# Patient Record
Sex: Female | Born: 1990 | Race: White | Hispanic: No | Marital: Single | Smoking: Never smoker
Health system: Southern US, Community
[De-identification: ages and names within clinical notes are randomized; demographics above are authoritative.]

## PROBLEM LIST (undated history)

## (undated) DIAGNOSIS — N809 Endometriosis, unspecified: Secondary | ICD-10-CM

## (undated) DIAGNOSIS — N200 Calculus of kidney: Secondary | ICD-10-CM

## (undated) DIAGNOSIS — D649 Anemia, unspecified: Secondary | ICD-10-CM

## (undated) DIAGNOSIS — J45909 Unspecified asthma, uncomplicated: Secondary | ICD-10-CM

## (undated) DIAGNOSIS — Z5189 Encounter for other specified aftercare: Secondary | ICD-10-CM

---
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
## 2010-02-02 ENCOUNTER — Emergency Department: Payer: Self-pay | Admitting: Emergency Medicine

## 2012-02-16 ENCOUNTER — Ambulatory Visit: Payer: Self-pay

## 2012-02-18 LAB — PATHOLOGY REPORT

## 2012-05-02 ENCOUNTER — Emergency Department: Payer: Self-pay | Admitting: Emergency Medicine

## 2012-05-02 LAB — URINALYSIS, COMPLETE
Bilirubin,UR: NEGATIVE
Blood: NEGATIVE
Ketone: NEGATIVE
Ph: 7 (ref 4.5–8.0)
Protein: NEGATIVE
Specific Gravity: 1.019 (ref 1.003–1.030)
Squamous Epithelial: 5
WBC UR: 1 /HPF (ref 0–5)

## 2012-05-02 LAB — COMPREHENSIVE METABOLIC PANEL
Albumin: 3.9 g/dL (ref 3.4–5.0)
Alkaline Phosphatase: 174 U/L — ABNORMAL HIGH (ref 50–136)
BUN: 12 mg/dL (ref 7–18)
Calcium, Total: 8.8 mg/dL (ref 8.5–10.1)
Co2: 24 mmol/L (ref 21–32)
Creatinine: 0.79 mg/dL (ref 0.60–1.30)
EGFR (African American): >60
Glucose: 101 mg/dL — ABNORMAL HIGH (ref 65–99)
Osmolality: 283 (ref 275–301)
Potassium: 4 mmol/L (ref 3.5–5.1)
Sodium: 142 mmol/L (ref 136–145)
Total Protein: 7.9 g/dL (ref 6.4–8.2)

## 2012-05-02 LAB — CBC
HCT: 42.7 % (ref 35.0–47.0)
MCHC: 33.4 g/dL (ref 32.0–36.0)
MCV: 86 fL (ref 80–100)
WBC: 8 10*3/uL (ref 3.6–11.0)

## 2012-05-02 LAB — LIPASE, BLOOD: Lipase: 114 U/L (ref 73–393)

## 2012-07-08 ENCOUNTER — Emergency Department: Payer: Self-pay | Admitting: Emergency Medicine

## 2012-07-08 LAB — URINALYSIS, COMPLETE
Bilirubin,UR: NEGATIVE
Blood: NEGATIVE
Ketone: NEGATIVE
Ph: 7 (ref 4.5–8.0)
RBC,UR: 7 /HPF (ref 0–5)
Specific Gravity: 1.021 (ref 1.003–1.030)
Squamous Epithelial: 4

## 2014-02-12 ENCOUNTER — Ambulatory Visit: Payer: Self-pay | Admitting: Emergency Medicine

## 2014-02-25 ENCOUNTER — Ambulatory Visit: Payer: Self-pay | Admitting: Physician Assistant

## 2014-07-13 ENCOUNTER — Emergency Department: Payer: Self-pay | Admitting: Emergency Medicine

## 2014-07-13 LAB — CBC
HCT: 40.4 %
HGB: 13.3 g/dL
MCH: 28.3 pg
MCHC: 32.9 g/dL
MCV: 86 fL
Platelet: 294 x10 3/mm 3
RBC: 4.7 X10 6/mm 3
RDW: 13.2 %
WBC: 9.6 x10 3/mm 3

## 2014-07-13 LAB — WET PREP, GENITAL

## 2014-07-13 LAB — GC/CHLAMYDIA PROBE AMP

## 2014-07-13 LAB — HCG, QUANTITATIVE, PREGNANCY: Beta Hcg, Quant.: <1 m[IU]/mL — ABNORMAL LOW

## 2014-10-29 ENCOUNTER — Emergency Department: Payer: Self-pay | Admitting: Emergency Medicine

## 2014-10-29 LAB — CBC WITH DIFFERENTIAL/PLATELET
Basophil #: 0 10*3/uL (ref 0.0–0.1)
Basophil %: 0.5 %
EOS ABS: 0.1 10*3/uL (ref 0.0–0.7)
EOS PCT: 1.1 %
HCT: 31.8 % — AB (ref 35.0–47.0)
HGB: 10.4 g/dL — ABNORMAL LOW (ref 12.0–16.0)
LYMPHS ABS: 2.3 10*3/uL (ref 1.0–3.6)
Lymphocyte %: 24.6 %
MCH: 27.5 pg (ref 26.0–34.0)
MCHC: 32.7 g/dL (ref 32.0–36.0)
MCV: 84 fL (ref 80–100)
MONO ABS: 0.4 x10 3/mm (ref 0.2–0.9)
MONOS PCT: 4.8 %
NEUTROS ABS: 6.3 10*3/uL (ref 1.4–6.5)
NEUTROS PCT: 69 %
Platelet: 375 10*3/uL (ref 150–440)
RBC: 3.78 10*6/uL — AB (ref 3.80–5.20)
RDW: 13.7 % (ref 11.5–14.5)
WBC: 9.2 10*3/uL (ref 3.6–11.0)

## 2014-10-29 LAB — URINALYSIS, COMPLETE
Bacteria: NONE SEEN
Bilirubin,UR: NEGATIVE
GLUCOSE, UR: NEGATIVE mg/dL (ref 0–75)
Leukocyte Esterase: NEGATIVE
Nitrite: NEGATIVE
PH: 5 (ref 4.5–8.0)
Protein: 30
RBC,UR: 30 /HPF (ref 0–5)
Specific Gravity: 1.023 (ref 1.003–1.030)
Squamous Epithelial: 1
WBC UR: 3 /HPF (ref 0–5)

## 2014-10-29 LAB — COMPREHENSIVE METABOLIC PANEL
ANION GAP: 9 (ref 7–16)
AST: 24 U/L (ref 15–37)
Albumin: 3.2 g/dL — ABNORMAL LOW (ref 3.4–5.0)
Alkaline Phosphatase: 122 U/L — ABNORMAL HIGH
BUN: 14 mg/dL (ref 7–18)
Bilirubin,Total: 0.2 mg/dL (ref 0.2–1.0)
CALCIUM: 8.4 mg/dL — AB (ref 8.5–10.1)
CO2: 25 mmol/L (ref 21–32)
Chloride: 105 mmol/L (ref 98–107)
Creatinine: 0.88 mg/dL (ref 0.60–1.30)
EGFR (Non-African Amer.): >60
Glucose: 102 mg/dL — ABNORMAL HIGH (ref 65–99)
Osmolality: 278 (ref 275–301)
Potassium: 3.9 mmol/L (ref 3.5–5.1)
SGPT (ALT): 70 U/L — ABNORMAL HIGH
SODIUM: 139 mmol/L (ref 136–145)
Total Protein: 7.2 g/dL (ref 6.4–8.2)

## 2014-10-29 LAB — LIPASE, BLOOD: LIPASE: 57 U/L — AB (ref 73–393)

## 2014-11-11 ENCOUNTER — Emergency Department: Payer: Self-pay | Admitting: Emergency Medicine

## 2014-12-01 ENCOUNTER — Inpatient Hospital Stay: Payer: Self-pay | Admitting: Internal Medicine

## 2014-12-01 LAB — COMPREHENSIVE METABOLIC PANEL
ANION GAP: 10 (ref 7–16)
Albumin: 3.1 g/dL — ABNORMAL LOW (ref 3.4–5.0)
Alkaline Phosphatase: 103 U/L
BILIRUBIN TOTAL: 0.2 mg/dL (ref 0.2–1.0)
BUN: 13 mg/dL (ref 7–18)
CALCIUM: 8.3 mg/dL — AB (ref 8.5–10.1)
Chloride: 110 mmol/L — ABNORMAL HIGH (ref 98–107)
Co2: 21 mmol/L (ref 21–32)
Creatinine: 1.15 mg/dL (ref 0.60–1.30)
EGFR (Non-African Amer.): >60
GLUCOSE: 111 mg/dL — AB (ref 65–99)
Osmolality: 282 (ref 275–301)
POTASSIUM: 3.9 mmol/L (ref 3.5–5.1)
SGOT(AST): 14 U/L — ABNORMAL LOW (ref 15–37)
SGPT (ALT): 26 U/L
Sodium: 141 mmol/L (ref 136–145)
TOTAL PROTEIN: 6.7 g/dL (ref 6.4–8.2)

## 2014-12-01 LAB — URINALYSIS, COMPLETE
Bilirubin,UR: NEGATIVE
GLUCOSE, UR: NEGATIVE mg/dL (ref 0–75)
Leukocyte Esterase: NEGATIVE
NITRITE: NEGATIVE
Ph: 5 (ref 4.5–8.0)
Protein: NEGATIVE
RBC,UR: 9 /HPF (ref 0–5)
SPECIFIC GRAVITY: 1.023 (ref 1.003–1.030)
WBC UR: 4 /HPF (ref 0–5)

## 2014-12-01 LAB — CBC WITH DIFFERENTIAL/PLATELET
BASOS ABS: 0.1 10*3/uL (ref 0.0–0.1)
BASOS ABS: 0 10*3/uL (ref 0.0–0.1)
BASOS PCT: 0.2 %
Basophil %: 0.6 %
EOS PCT: 0.1 %
Eosinophil #: 0 10*3/uL (ref 0.0–0.7)
Eosinophil #: 0 10*3/uL (ref 0.0–0.7)
Eosinophil %: 0.1 %
HCT: 24.5 % — ABNORMAL LOW (ref 35.0–47.0)
HCT: 24.9 % — ABNORMAL LOW (ref 35.0–47.0)
HGB: 7.2 g/dL — ABNORMAL LOW (ref 12.0–16.0)
HGB: 7.4 g/dL — ABNORMAL LOW (ref 12.0–16.0)
LYMPHS PCT: 7 %
Lymphocyte #: 0.9 10*3/uL — ABNORMAL LOW (ref 1.0–3.6)
Lymphocyte #: 1 10*3/uL (ref 1.0–3.6)
Lymphocyte %: 6.7 %
MCH: 22.4 pg — AB (ref 26.0–34.0)
MCH: 22.4 pg — ABNORMAL LOW (ref 26.0–34.0)
MCHC: 29.6 g/dL — ABNORMAL LOW (ref 32.0–36.0)
MCHC: 29.9 g/dL — ABNORMAL LOW (ref 32.0–36.0)
MCV: 76 fL — AB (ref 80–100)
MCV: 75 fL — ABNORMAL LOW (ref 80–100)
MONO ABS: 0.5 x10 3/mm (ref 0.2–0.9)
Monocyte #: 0.4 x10 3/mm (ref 0.2–0.9)
Monocyte %: 3.3 %
Monocyte %: 3.4 %
Neutrophil #: 11.7 10*3/uL — ABNORMAL HIGH (ref 1.4–6.5)
Neutrophil #: 13.1 10*3/uL — ABNORMAL HIGH (ref 1.4–6.5)
Neutrophil %: 89 %
Neutrophil %: 89.6 %
Platelet: 349 10*3/uL (ref 150–440)
Platelet: 359 10*3/uL (ref 150–440)
RBC: 3.24 10*6/uL — ABNORMAL LOW (ref 3.80–5.20)
RBC: 3.33 10*6/uL — AB (ref 3.80–5.20)
RDW: 18.3 % — AB (ref 11.5–14.5)
RDW: 18.2 % — ABNORMAL HIGH (ref 11.5–14.5)
WBC: 13.1 10*3/uL — ABNORMAL HIGH (ref 3.6–11.0)
WBC: 14.6 10*3/uL — ABNORMAL HIGH (ref 3.6–11.0)

## 2014-12-01 LAB — LIPASE, BLOOD: Lipase: 69 U/L — ABNORMAL LOW (ref 73–393)

## 2014-12-01 LAB — HCG, QUANTITATIVE, PREGNANCY

## 2014-12-02 LAB — CBC WITH DIFFERENTIAL/PLATELET
Basophil #: 0 10*3/uL (ref 0.0–0.1)
Basophil %: 0.1 %
EOS PCT: 0 %
Eosinophil #: 0 10*3/uL (ref 0.0–0.7)
HCT: 24.2 % — ABNORMAL LOW (ref 35.0–47.0)
HGB: 7.4 g/dL — AB (ref 12.0–16.0)
LYMPHS ABS: 1.7 10*3/uL (ref 1.0–3.6)
Lymphocyte %: 13.7 %
MCH: 22.9 pg — AB (ref 26.0–34.0)
MCHC: 30.5 g/dL — ABNORMAL LOW (ref 32.0–36.0)
MCV: 75 fL — AB (ref 80–100)
Monocyte #: 0.6 x10 3/mm (ref 0.2–0.9)
Monocyte %: 4.7 %
Neutrophil #: 10.1 10*3/uL — ABNORMAL HIGH (ref 1.4–6.5)
Neutrophil %: 81.5 %
Platelet: 379 10*3/uL (ref 150–440)
RBC: 3.22 10*6/uL — AB (ref 3.80–5.20)
RDW: 18.3 % — ABNORMAL HIGH (ref 11.5–14.5)
WBC: 12.4 10*3/uL — ABNORMAL HIGH (ref 3.6–11.0)

## 2014-12-02 LAB — BASIC METABOLIC PANEL
ANION GAP: 8 (ref 7–16)
BUN: 10 mg/dL (ref 7–18)
CALCIUM: 8.2 mg/dL — AB (ref 8.5–10.1)
CO2: 24 mmol/L (ref 21–32)
Chloride: 111 mmol/L — ABNORMAL HIGH (ref 98–107)
Creatinine: 1 mg/dL (ref 0.60–1.30)
EGFR (African American): >60
EGFR (Non-African Amer.): >60
Glucose: 104 mg/dL — ABNORMAL HIGH (ref 65–99)
Osmolality: 284 (ref 275–301)
Potassium: 3.8 mmol/L (ref 3.5–5.1)
Sodium: 143 mmol/L (ref 136–145)

## 2014-12-03 LAB — CBC WITH DIFFERENTIAL/PLATELET
BASOS ABS: 0 10*3/uL (ref 0.0–0.1)
Basophil %: 0.3 %
Eosinophil #: 0 10*3/uL (ref 0.0–0.7)
Eosinophil %: 0.1 %
HCT: 25.3 % — AB (ref 35.0–47.0)
HGB: 7.7 g/dL — ABNORMAL LOW (ref 12.0–16.0)
Lymphocyte #: 1.7 10*3/uL (ref 1.0–3.6)
Lymphocyte %: 16.5 %
MCH: 22.8 pg — ABNORMAL LOW (ref 26.0–34.0)
MCHC: 30.5 g/dL — AB (ref 32.0–36.0)
MCV: 75 fL — ABNORMAL LOW (ref 80–100)
MONOS PCT: 4.4 %
Monocyte #: 0.5 x10 3/mm (ref 0.2–0.9)
NEUTROS ABS: 8.1 10*3/uL — AB (ref 1.4–6.5)
Neutrophil %: 78.7 %
Platelet: 341 10*3/uL (ref 150–440)
RBC: 3.37 10*6/uL — ABNORMAL LOW (ref 3.80–5.20)
RDW: 17.9 % — AB (ref 11.5–14.5)
WBC: 10.3 10*3/uL (ref 3.6–11.0)

## 2014-12-03 LAB — URINE CULTURE

## 2014-12-04 LAB — HEMOGLOBIN: HGB: 8.2 g/dL — ABNORMAL LOW (ref 12.0–16.0)

## 2014-12-09 DIAGNOSIS — K2270 Barrett's esophagus without dysplasia: Secondary | ICD-10-CM | POA: Insufficient documentation

## 2014-12-09 DIAGNOSIS — N200 Calculus of kidney: Secondary | ICD-10-CM | POA: Insufficient documentation

## 2014-12-09 DIAGNOSIS — N12 Tubulo-interstitial nephritis, not specified as acute or chronic: Secondary | ICD-10-CM | POA: Insufficient documentation

## 2014-12-16 ENCOUNTER — Ambulatory Visit: Payer: Self-pay | Admitting: Urology

## 2015-03-02 NOTE — Op Note (Signed)
PATIENT NAMJamesetta Orleans:  [name], Ashley [name] MR#:  [id number] DATE OF BIRTH:  04-10-1991  DATE OF PROCEDURE:  02/16/2012  PREOPERATIVE DIAGNOSIS: Incomplete abortion.   POSTOPERATIVE DIAGNOSIS: Incomplete abortion.   OPERATION PERFORMED: Completion curettage.   SURGEON: Deloris Pinghilip J. Luella Cookosenow, MD    OPERATIVE FINDINGS: Small amount of tissue.   OPERATION: After adequate general anesthesia, the patient was prepped and draped in routine fashion. Cervix was grasped with a Jacob's tenaculum. It was already dilated. The uterine cavity was systematically suctioned and curetted with #12 suction curette with return of a small amount of tissue. Controlled curettage revealed a "clean" field for 360 degrees.   The patient tolerated the procedure well and left the operating room in good condition. Sponge and needle counts were said to be correct at the end of procedure.   ____________________________ Deloris PingPhilip J. Luella Cookosenow, MD pjr:drc [name]: 02/16/2012 11:11:22 ET T: 02/16/2012 11:59:14 ET JOB#: [id number]  cc: Deloris PingPhilip J. Luella Cookosenow, MD, <Dictator> Towana BadgerPHILIP J Curry Dulski MD ELECTRONICALLY SIGNED 02/29/2012 18:54

## 2015-03-09 NOTE — H&P (Signed)
PATIENT NAMEADEAN, Ashley [name] MR#:  [id number] DATE OF BIRTH:  09-Feb-1991  DATE OF ADMISSION:  12/01/2014  REFERRING PHYSICIAN: Sheryl L. Mindi Junker, MD   FAMILY PHYSICIAN: Nonlocal.   REASON FOR ADMISSION: Abdominal pain with nausea, vomiting.   HISTORY OF PRESENT ILLNESS: The patient is a 24 year old female with a history of asthma and miscarriage with D and C approximately 1 year ago. Presents to the Emergency Room with a 1-2 day history of right-sided abdominal pain associated with intractable nausea and vomiting. She cannot keep anything down. In the Emergency Room, the patient was noted to be profoundly anemic. CT scan revealed an obstructing stone in the right kidney. She is now admitted for further evaluation.   PAST MEDICAL HISTORY:  1.  Status post miscarriage with D and C 1 year ago.  2.  Microcytic anemia.  3.  Asthma.  4.  Nephrolithiasis.   MEDICATIONS:  1.  Baclofen 10 mg p.o. daily as needed.  2.  Naproxen 500 mg p.o. b.i.d. as needed.  3.  Oral birth control 1 p.o. daily.  4.  Ventolin 2 puffs q.i.d. p.r.n. shortness of breath.  5.  Zofran 4 mg p.o. q. 8 hours p.r.n. nausea and vomiting.   ALLERGIES: PERCOCET.   SOCIAL HISTORY: Negative for alcohol or tobacco abuse.   FAMILY HISTORY: Positive for hypertension, but otherwise unremarkable.   REVIEW OF SYSTEMS: CONSTITUTIONAL: No fever or change in weight.  EYES: No blurred or double vision. No glaucoma.  EARS, NOSE, AND THROAT: No tinnitus or hearing loss. No nasal discharge or bleeding. No difficulty swallowing.  RESPIRATORY: No cough or wheezing. Denies hemoptysis. No painful respiration.  CARDIOVASCULAR: No chest pain or orthopnea. No palpitations or syncope.  GASTROINTESTINAL: The patient has had nausea and vomiting, but denies diarrhea. Has had abdominal pain. No change in bowel habits.  GENITOURINARY: No hematuria, although she has had some dysuria. No incontinence.  ENDOCRINE: No polyuria or polydipsia. No  heat or cold intolerance.  HEMATOLOGIC: The patient admits to anemia, but denies bruising or bleeding.  LYMPHATIC: No swollen glands.  MUSCULOSKELETAL: The patient denies pain in her neck, shoulders, knees, hips. Does have some back pain. No gout.  NEUROLOGIC: No numbness or migraines. Denies stroke or seizures.  PSYCHOLOGICAL: The patient denies anxiety, insomnia or depression.   PHYSICAL EXAMINATION:  GENERAL: The patient is in no acute distress.  VITAL SIGNS: Currently remarkable for a blood pressure of 117/69 with a heart rate of 76, respiratory rate of 18, temperature of 98.5 and a saturation 100% on room air.  HEENT: Normocephalic, atraumatic. Pupils equally round, reactive to light and accommodation. Extraocular movements are intact. Sclerae are anicteric. Conjunctivae are clear. Oropharynx is clear. NECK: Supple without JVD. No adenopathy or thyromegaly is noted.  LUNGS: Clear to auscultation and percussion without wheezes, rales or rhonchi. No dullness. Respiratory effort is normal.  CARDIAC: Regular rate and rhythm with a normal S1, S2. No significant rubs or gallops noted. There is a systolic ejection murmur present. PMI is nondisplaced. Chest wall is nontender.  ABDOMEN: Soft with some right-sided CVA tenderness. No rebound or guarding. Normoactive bowel sounds. No organomegaly or masses were appreciated. No hernias or bruits were noted.  EXTREMITIES: Without clubbing, cyanosis or edema. Pulses were 2+ bilaterally.  SKIN: Warm and dry without rash or lesions.  NEUROLOGIC: Revealed cranial nerves II-XII grossly intact. Deep tendon reflexes were symmetric. Motor and sensory examination is nonfocal.  PSYCHIATRIC: Revealed a patient who is alert and oriented  to person, place and time. She was cooperative and used good judgment.   LABORATORY DATA: Glucose was 111 with a BUN of 13, creatinine 1.15 and a GFR of greater than 60. Her lipase was 69. Her white count was 14.6 with a hemoglobin of  7.4. MCV was 75. Urinalysis revealed 2+ blood with 9 rbc's per high-powered field with trace bacteria. CT of the abdomen and pelvis revealed right-sided hydronephrosis with an obstructing 4 mm stone.   ASSESSMENT:  1.  Acute nephrolithiasis with hydronephrosis.  2.  Abdominal pain.  3.  Intractable nausea and vomiting.  4.  Iron-deficiency anemia.  5.  Menorrhagia.   PLAN: The patient will be admitted to the floor with IV pain medications and IV Zofran as needed for nausea or vomiting. We will begin IV fluids. She will be kept n.p.o. except for ice chips and medications. We will obtain a consult from urology in regard to her kidney stone and a consult from GYN in regard to her menorrhagia and anemia. We will follow up routine laboratories in the morning. Begin empiric IV antibiotics at this time. Further treatment and evaluation will depend upon the patient's progress.   TOTAL TIME SPENT ON THIS PATIENT: Fifty minutes.    ____________________________ Duane LopeJeffrey D. Judithann SheenSparks, MD jds:TT D: 12/01/2014 19:49:15 ET T: 12/01/2014 20:11:11 ET JOB#: [id number]  cc: Duane LopeJeffrey D. Judithann SheenSparks, MD, <Dictator> JEFFREY Rodena Medin SPARKS MD ELECTRONICALLY SIGNED 12/02/2014 11:03

## 2015-03-09 NOTE — Consult Note (Signed)
Chief Complaint:  Subjective/Chief Complaint pod 1 s/p right ureteral stent. Pain improved. No n/v, no fevers/chills, wants to go home   VITAL SIGNS/ANCILLARY NOTES: **Vital Signs.:   25-Jan-16 08:07  Vital Signs Type Q 4hr; Post op  Temperature Temperature (F) 98.4  Celsius 36.8  Temperature Source oral  Pulse Pulse 99  Respirations Respirations 18  Systolic BP Systolic BP 222  Diastolic BP (mmHg) Diastolic BP (mmHg) 76  Mean BP 90  Pulse Ox % Pulse Ox % 97  Pulse Ox Activity Level  At rest  Oxygen Delivery Room Air/ 21 %  *Intake and Output.:   Daily 25-Jan-16 07:00  Grand Totals Intake:  747 Output:  800    Net:  -60 24 Hr.:  -53  IV (Primary)      In:  747  Urine ml     Out:  800  Length of Stay Totals Intake:  747 Output:  800    Net:  -53   Brief Assessment:  GEN well developed, well nourished   Cardiac Regular  no murmur  --Rub  --Gallop   Respiratory normal resp effort  clear BS   Gastrointestinal Normal   Gastrointestinal details normal Soft  Nontender  Nondistended   EXTR negative cyanosis/clubbing, negative edema   Additional Physical Exam no CVAT   Lab Results: Routine Chem:  25-Jan-16 05:00   Glucose, Serum  104  BUN 10  Creatinine (comp) 1.00  Sodium, Serum 143  Potassium, Serum 3.8  Chloride, Serum  111  CO2, Serum 24  Calcium (Total), Serum  8.2  Anion Gap 8  Osmolality (calc) 284  eGFR (African American) >60  eGFR (Non-African American) >60 (eGFR values <23m/min/1.73 m2 may be an indication of chronic kidney disease (CKD). Calculated eGFR, using the MRDR Study equation, is useful in  patients with stable renal function. The eGFR calculation will not be reliable in acutely ill patients when serum creatinine is changing rapidly. It is not useful in patients on dialysis. The eGFR calculation may not be applicable to patients at the low and high extremes of body sizes, pregnant women, and vegetarians.)  Routine Hem:  25-Jan-16  05:00   WBC (CBC)  12.4  RBC (CBC)  3.22  Hemoglobin (CBC)  7.4  Hematocrit (CBC)  24.2  Platelet Count (CBC) 379  MCV  75  MCH  22.9  MCHC  30.5  RDW  18.3  Neutrophil % 81.5  Lymphocyte % 13.7  Monocyte % 4.7  Eosinophil % 0.0  Basophil % 0.1  Neutrophil #  10.1  Lymphocyte # 1.7  Monocyte # 0.6  Eosinophil # 0.0  Basophil # 0.0 (Result(s) reported on 02 Dec 2014 at 07:11AM.)   Radiology Results: CT:    24-Jan-16 17:47, CT Abdomen and Pelvis With Contrast  CT Abdomen and Pelvis With Contrast   REASON FOR EXAM:    (1) abd pain; (2) pel pain  COMMENTS:       PROCEDURE: CT  - CT ABDOMEN / PELVIS  W  - Dec 01 2014  5:47PM     CLINICAL DATA:  Acute onset of right-sided abdominal and pelvic  pain. Nausea and vomiting. Initial encounter.    EXAM:  CT ABDOMEN AND PELVIS WITH CONTRAST    TECHNIQUE:  Multidetector CT imaging of the abdomen and pelvis was performed  using the standard protocol following bolus administration of  intravenous contrast.  CONTRAST:  100 mL of Omnipaque 350 IV contrast    COMPARISON:  CT of  the abdomen and pelvis from 09/14/2009    FINDINGS:  The visualized lung bases are clear. There is mild wall thickening  at the distal esophagus. Would correlate clinically for history  relating to Barrett's esophagus.    The liver and spleen are unremarkable in appearance. The gallbladder  is within normal limits. The pancreas and adrenal glands are  unremarkable.    There is mild right-sided hydronephrosis, with prominence of the  right ureter to the level of an obstructing 4 mm stone distally,  near the right vesicoureteral function. There is mild associated  soft tissue inflammation about the level of the stone. There appear  to be two ureters arising from upper and lower pole moieties of the  right kidney, though these merge relatively proximally. There is  mildly decreased enhancement of the right kidney, with hazy  surrounding perinephric  stranding. Underlying pyelonephritis cannot  be excluded.    The left kidney is unremarkable in appearance. No nonobstructing  renal stones are identified.    No free fluid is identified. The proximal small bowel is  unremarkable in appearance. The stomach is within normal limits. No  acute vascular abnormalities are seen.  The appendix is normal in caliber, without evidence for  appendicitis. Mild apparent fatty infiltration within the wall of  the entire colon may reflect chronic sequelae of inflammation. There  may also be mild fatty infiltration within the wall of the distal  ileum, difficult to fully assess due to decompression.    The bladder is decompressed and not well assessed. The uterus is  unremarkable in appearance. The ovaries are relatively symmetric. No  suspicious adnexal masses are seen. No inguinal lymphadenopathy is  seen.    No acute osseous abnormalities are identified.     IMPRESSION:  1. Mild right-sided hydronephrosis, with an obstructing 4 mm stone  in the distal right ureter, near the right vesicoureteral junction.  Mild soft tissue inflammation about the level of the stone. Two  right-sided ureters merge relatively proximally.  2. Mildly decreased right renal enhancement, with hazy surrounding  perinephric stranding. Would correlate clinically for evidence of  pyelonephritis.  3. Mild wall thickening at the distal esophagus. Would correlate for  history relating to Barrett's esophagus.  4. Mild apparent fatty infiltration within the wall of the entirety  of the colon may reflect chronic sequelae of prior inflammation.  Suggestion of mild fatty infiltration within the wall ofthe distal  ileum, difficult to fully assess due to decompression.      Electronically Signed    By: Garald Balding M.D.    On: 12/01/2014 17:56         Verified By: JEFFREY . CHANG, M.D.,   Assessment/Plan:  Assessment/Plan:  Assessment 24 year old woman with  obstructing right ureteral calculus, now pod 1 s/p right ureteral stent. WBC improving. Anemia noted and being workup up by primary service.   Plan 1) OK to discharge from GU standpoint 2) Continue ABx for 7 days 3) Please arrange follow-up with Dr. Erlene Quan (Urology) in 1-2 weeks to plan definitive ureteral stone management.  Thank you for involving me in the care of Ashley [name]. Please call Urology with any further questions.   Electronic Signatures: Prentiss Bells (MD)  (Signed 25-Jan-16 09:34)  Authored: Chief Complaint, VITAL SIGNS/ANCILLARY NOTES, Brief Assessment, Lab Results, Radiology Results, Assessment/Plan   Last Updated: 25-Jan-16 09:34 by Prentiss Bells (MD)

## 2015-03-09 NOTE — Discharge Summary (Signed)
PATIENT NAMJamesetta Orleans:  [name], Ashley [name] DATE OF BIRTH:  Jul 10, 1991  DATE OF ADMISSION:  12/01/2014 DATE OF DISCHARGE:  12/04/2014  PRESENTING COMPLAINT: Abdominal pain.   DISCHARGE DIAGNOSES: 1. Acute pyelonephritis, acute right distal ureteral stent placement due to ureteral stone.  2. History of endometriosis.  3. Anemia status post blood transfusion.   PROCEDURE: Right ureteral stent placement on 12/01/2014.   CODE STATUS: FULL CODE.   MEDICATIONS:  1. Baclofen 10 mg 1 as needed for muscle spasm.  2. Naproxen 500 mg b.i.d. daily as needed.  3. Ventolin HFA 2 puffs 4 times a day as needed.  4. Zofran 4 mg 1 tablet every 8 hours as needed.  5. Seasonique biphasic extended cycle oral tablet p.o. daily.  6. Dilaudid 2 mg 3 times a day as needed.  7. Ceftin 500 mg b.i.d.  8. Ferrous fumarate with folic acid 1 capsule b.i.d.   DIET: Mechanical soft diet.   FOLLOWUP:  1. Follow up with Dr. Vanna ScotlandAshley Brandon, urology, in 1 to 2 weeks.  2. Follow up with Dr. Greggory KeeneFrancesco, gynecology, in 1 to 2 weeks.  3. Follow up with your primary care physician, Dr. Angus PalmsSionne George, in 2 weeks.   CONSULTATIONS:  1. Urology consultation with Dr. Arlyce DiceKaplan.  2. Gynecology consultation with Dr. Greggory KeeneFrancesco.   RADIOLOGICAL PROCEDURES AND LABORATORY DATA: Ultrasound pelvis, transvaginal, showed normal appearance of uterus and both ovaries. No evidence of fibroids or other significant abnormality.   Hemoglobin at discharge is 8.2.   CT of the abdomen and pelvis with contrast shows mild right-sided hydronephrosis with obstructing 4 mm stone in the distal right ureter  near the right vesicourethral junction. Mild soft tissue inflammation about the level of the stone.  Mild decreased right renal enhancement with hazy surrounding perinephric stranding, I would correlate for evidence of pyelonephritis. Mild wall thickening of the distal esophagus.  Mild apparent fatty infiltration within the wall of the  entirety of the colon may reflect chronic sequelae of prior  inflammation.   BRIEF SUMMARY OF HOSPITAL COURSE: Ashley [name] is a 24 year old, Caucasian female, who comes in with:   1. Acute ureterolithiasis with hydronephrosis, pyelonephritis per CT findings. The patient underwent right ureteral stent placement by urology. She will continue to be on p.o. antibiotics, p.o. Dilaudid, and ibuprofen t.i.d. with meals. It was recommended she will follow up with Dr. Apolinar JunesBrandon as outpatient.  2. Abdominal pain, likely due to ureteral stone. The patient does have some history of endometriosis. GYN consult was obtained. A pelvic sonogram is negative. Recommended follow up as outpatient.  3. Intractable nausea and vomiting, improved. The patient received IV fluids.  4. Iron deficiency anemia, replaced orally. The reason is menorrhagia. She started on birth control pills. The patient had 1 unit of blood transfusion.   Overall hospital stay remained stable. The patient was improving slowly.   Discharge plan was discussed with the patient's mother.   TIME SPENT: 40 minutes.    ____________________________ Ashley HailSona A. Allena KatzPatel, MD sap:JT D: 12/05/2014 06:56:15 ET T: 12/05/2014 12:25:44 ET JOB#: [id number]  cc: Gredmarie Delange A. Allena KatzPatel, MD, <Dictator> Claris GladdenAshley J. Brandon, MD Prentice DockerMartin A. DeFrancesco, MD Angus PalmsSionne George, MD Graceann CongressAdam G. Kaplan, MD    Willow OraSONA A Sakshi Sermons MD ELECTRONICALLY SIGNED 12/10/2014 11:53

## 2015-03-09 NOTE — Op Note (Signed)
Patient: This 24 year old Female had a surgical procedure performed on 01-Dec-2014.  Post Operative Report:  Pre-Op Diagnosis right ureteral calculus with hydronephrosis, leukocytosis   Post-Op Diagnosis Same   Operation cystoscopy, right retrograde ureteropyelogram, right ureteral stent   Anesthesia General   Specimen Type Describe  right renal pelvis urine sent for culture   Findings Right partially duplicated collecting system   Surgeon Edwyna ReadyGarjae Marbeth Smedley, MD   EBL: None   Complications None   Description of Procedure: The risks, benefits, alternatives and possible complications were discussed with the patient and informed consent was obtained. Patient received intravenous ciprofloxacin for preoperative antibiotic prophylaxis. Venodynes were placed on bilateral lower extremities.    She underwent an uneventful intubation  She was placed in dorsal lithotomy position. Her genitalia and perineum were prepped and draped in sterile fashion. Scout flims were taken which revealed persistent contrast in the collecting systwem. a 21 fr rigid cystoscope was generously lubricated and inserted per urethra and the bladder was inspected. No masses, lesions, stones, or foreign bodies were noted. With the aid of a guide wire, a 5 french ureteral access catheter was passed  in to the right  proximal ureter at the level of the duplication. The wire was removed and a hydronephrotic drip was noted. The renal pelvis urine was sent for culture assessment.   Retrograde pyelogram with performed by injection of 50% diluted conray, 10 ml volume total. 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   The wire was replaced and the access catheter was removed. A 106fr x 24 cm double j ureteral stent was placed under endoscopic and flouroscopic guidance with a good proximal curl noted in the lower pole moiety  and a good curl noted in the bladder. Efflux of purulent debris was noted. The bladder was drained. The patient was extubated and transferred for recovery.   Electronic Signatures: Naaman PlummerLavien, Keyaria Lawson D (MD)  (Signed 24-Jan-16 22:05)  Authored: Patient and Date/Time, Operative Note   Last Updated: 24-Jan-16 22:05 by Naaman PlummerLavien, Petar Mucci D (MD)

## 2015-03-09 NOTE — Consult Note (Signed)
Admit Diagnosis:   VOMITING RT SIDE PAIN: Onset Date: 01-Dec-2014, Status: Active, Description: VOMITING RT SIDE PAIN      Admit Reason:   Calculus of kidney (N20.0): Onset Date: 01-Dec-2014, Status: Active, Coding System: ICD10, Coded Name: Calculus of kidney, Description: right ureteral calculus   General Aspect Chief Complaint: Nausea, vomiting abdominal pain   Present Illness 24 y.o female with a past medical history for nephrolithiasis present with a 24 hour history of progressively worsening nausea, vomiting, right abdominal pain. CT scan performed demonstrating a 77m ureteral calculus. Urology consulted for evaluation. Patient notes poor po intake with the inability to keep fluids down due to pain and emesis. She denies subjective fevers, chills, gross hematuria    Percocet 10/325: Dizzy/Fainting  Case History and Physical Exam:  Chief Complaint Abdominal Pain  Nausea/Vomiting   Past Medical Health history of vaginal bleeding   Past Surgical History D & C, no prior urologic history   Family History nephrolithiasis, brother. Social History: no ilicit drug use, lives with her mom   HEENT PERLA   Neck/Nodes Supple  No Adenopathy   Chest/Lungs normal respiratory effort   Cardiovascular No Murmurs or Gallops  Normal Sinus Rhythm   Abdomen Benign  right CVA tenderness   Rectal Not examined   Musculoskeletal Full range of motion   Neurological Grossly WNL   Skin Warm  Dry   Hepatic:  24-Jan-16 16:30   Bilirubin, Total 0.2  Alkaline Phosphatase 103 (46-116 NOTE: New Reference Range 05/28/14)  SGPT (ALT) 26 (14-63 NOTE: New Reference Range 05/28/14)  SGOT (AST)  14  Total Protein, Serum 6.7  Albumin, Serum  3.1  Routine BB:  24-Jan-16 17:20   ABO Group + Rh Type O Negative  Antibody Screen NEGATIVE (Result(s) reported on 01 Dec 2014 at 06:12PM.)  Routine Chem:  24-Jan-16 16:30   HCG Betasubunit Quant. Serum  < 1 (1-3  (International Unit)   ----------------- Non-pregnant <5 Weeks Post LMP mIU/mL  3- 4 wk 9 - 130  4- 5 wk 75 - 2,600  5- 6 wk 850 - 20,800  6- 7 wk 4,000 - 100,000  7-12 wk 11,500 - 289,000 12-16 wk 18,000 - 137,000 16-29 wk 1,400 - 53,000 29-41 wk 940 - 60,000)  Glucose, Serum  111  BUN 13  Creatinine (comp) 1.15  Sodium, Serum 141  Potassium, Serum 3.9  Chloride, Serum  110  CO2, Serum 21  Calcium (Total), Serum  8.3  Osmolality (calc) 282  eGFR (African American) >60  eGFR (Non-African American) >60 (eGFR values <640mmin/1.73 m2 may be an indication of chronic kidney disease (CKD). Calculated eGFR, using the MRDR Study equation, is useful in  patients with stable renal function. The eGFR calculation will not be reliable in acutely ill patients when serum creatinine is changing rapidly. It is not useful in patients on dialysis. The eGFR calculation may not be applicable to patients at the low and high extremes of body sizes, pregnant women, and vegetarians.)  Anion Gap 10  Lipase  69 (Result(s) reported on 01 Dec 2014 at 05:02PM.)  Routine UA:  24-Jan-16 17:09   Color (UA) Yellow  Clarity (UA) Turbid  Glucose (UA) Negative  Bilirubin (UA) Negative  Ketones (UA) Trace  Specific Gravity (UA) 1.023  Blood (UA) 2+  pH (UA) 5.0  Protein (UA) Negative  Nitrite (UA) Negative  Leukocyte Esterase (UA) Negative (Result(s) reported on 01 Dec 2014 at 06:51PM.)  RBC (UA) 9 /HPF  WBC (UA) 4 /HPF  Bacteria (UA) TRACE  Epithelial Cells (UA) 1 /HPF  Mucous (UA) PRESENT  Uric Acid Crystal (UA) PRESENT (Result(s) reported on 01 Dec 2014 at 06:51PM.)  Routine Hem:  24-Jan-16 16:30   WBC (CBC)  13.1  RBC (CBC)  3.24  Hemoglobin (CBC)  7.2  Hematocrit (CBC)  24.5  Platelet Count (CBC) 349  MCV  76  MCH  22.4  MCHC  29.6  RDW  18.3  Neutrophil % 89.0  Lymphocyte % 7.0  Monocyte % 3.3  Eosinophil % 0.1  Basophil % 0.6  Neutrophil #  11.7  Lymphocyte #  0.9  Monocyte # 0.4  Eosinophil # 0.0   Basophil # 0.1 (Result(s) reported on 01 Dec 2014 at 04:46PM.)    17:20   WBC (CBC)  14.6  RBC (CBC)  3.33  Hemoglobin (CBC)  7.4  Hematocrit (CBC)  24.9  Platelet Count (CBC) 359  MCV  75  MCH  22.4  MCHC  29.9  RDW  18.2  Neutrophil % 89.6  Lymphocyte % 6.7  Monocyte % 3.4  Eosinophil % 0.1  Basophil % 0.2  Neutrophil #  13.1  Lymphocyte # 1.0  Monocyte # 0.5  Eosinophil # 0.0  Basophil # 0.0 (Result(s) reported on 01 Dec 2014 at 05:43PM.)   CT:    24-Jan-16 17:47, CT Abdomen and Pelvis With Contrast  CT Abdomen and Pelvis With Contrast   REASON FOR EXAM:    (1) abd pain; (2) pel pain  COMMENTS:       PROCEDURE: CT  - CT ABDOMEN / PELVIS  W  - Dec 01 2014  5:47PM     CLINICAL DATA:  Acute onset of right-sided abdominal and pelvic  pain. Nausea and vomiting. Initial encounter.    EXAM:  CT ABDOMEN AND PELVIS WITH CONTRAST    TECHNIQUE:  Multidetector CT imaging of the abdomen and pelvis was performed  using the standard protocol following bolus administration of  intravenous contrast.  CONTRAST:  100 mL of Omnipaque 350 IV contrast    COMPARISON:  CT of the abdomen and pelvis from 09/14/2009    FINDINGS:  The visualized lung bases are clear. There is mild wall thickening  at the distal esophagus. Would correlate clinically for history  relating to Barrett's esophagus.    The liver and spleen are unremarkable in appearance. The gallbladder  is within normal limits. The pancreas and adrenal glands are  unremarkable.    There is mild right-sided hydronephrosis, with prominence of the  right ureter to the level of an obstructing 4 mm stone distally,  near the right vesicoureteral function. There is mild associated  soft tissue inflammation about the level of the stone. There appear  to be two ureters arising from upper and lower pole moieties of the  right kidney, though these merge relatively proximally. There is  mildly decreased enhancement of the right  kidney, with hazy  surrounding perinephric stranding. Underlying pyelonephritis cannot  be excluded.    The left kidney is unremarkable in appearance. No nonobstructing  renal stones are identified.    No free fluid is identified. The proximal small bowel is  unremarkable in appearance. The stomach is within normal limits. No  acute vascular abnormalities are seen.  The appendix is normal in caliber, without evidence for  appendicitis. Mild apparent fatty infiltration within the wall of  the entire colon may reflect chronic sequelae of inflammation. There  may also be mild fatty infiltration within the wall  of the distal  ileum, difficult to fully assess due to decompression.    The bladder is decompressed and not well assessed. The uterus is  unremarkable in appearance. The ovaries are relatively symmetric. No  suspicious adnexal masses are seen. No inguinal lymphadenopathy is  seen.    No acute osseous abnormalities are identified.     IMPRESSION:  1. Mild right-sided hydronephrosis, with an obstructing 4 mm stone  in the distal right ureter, near the right vesicoureteral junction.  Mild soft tissue inflammation about the level of the stone. Two  right-sided ureters merge relatively proximally.  2. Mildly decreased right renal enhancement, with hazy surrounding  perinephric stranding. Would correlate clinically for evidence of  pyelonephritis.  3. Mild wall thickening at the distal esophagus. Would correlate for  history relating to Barrett's esophagus.  4. Mild apparent fatty infiltration within the wall of the entirety  of the colon may reflect chronic sequelae of prior inflammation.  Suggestion of mild fatty infiltration within the wall ofthe distal  ileum, difficult to fully assess due to decompression.      Electronically Signed    By: Garald Balding M.D.    On: 12/01/2014 17:56         Verified By: JEFFREY . CHANG, M.D.,    Impression 4 mm right distal ureteral  calculus in the setting of a leukocytosis with left shift, poor po intake x 24 hours due to emesis. 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 NPO 2. IV fluid hydration 3. PRN pain control 4. to OR for cystoscopy, retrograde pyelogram, right ureteral stent placement. The risks, benefits, alternatives, and possible complications were discussed with the patient and her mom and informed consent was obtained. 5. Patient will need preprocedural antibiotics   Electronic Signatures: Tera Mater (MD)  (Signed 24-Jan-16 19:35)  Authored: Health Issues, General Aspect/Present Illness, Allergies, History and Physical Exam, Labs, Radiology, Impression/Plan   Last Updated: 24-Jan-16 19:35 by Tera Mater (MD)

## 2015-03-09 NOTE — Op Note (Signed)
PATIENT NAMJamesetta [name]:  [name], Ashley D MR#:  [id number] DATE OF BIRTH:  [birth year]  DATE OF PROCEDURE:  12/16/2014   PREOPERATIVE DIAGNOSIS: Right ureteral stone.  POSTOPERATIVE DIAGNOSIS:  Right ureteral stone.   PROCEDURE PERFORMED:  Right ureteroscopy, stone extraction, right ureteral stent exchange.   ATTENDING SURGEON: Claris GladdenAshley J. Younis [name], M.D.   ANESTHESIA: General anesthesia.  ESTIMATED BLOOD LOSS:  Minimal.   DRAINS: A 6 x 24 French double-J ureteral stent on a string.   COMPLICATIONS: None.   SPECIMENS: None.   INDICATION: This is a 24 year old female who presented to the Emergency Room with right acute onset flank pain, found to have right hydronephrosis and a distal 4 mm right ureteral stone. She underwent urgent ureteral stent placement and [facility] of definitive management of her stone. She was offered, given this very small size of the stone, either for stent removal in hopes that the stone fragment would pass with the stent versus proceeding to the Operating Room for definitive management of the stones.  She was elected to proceed to the Operating Room. Risks and benefits of the procedure were explained in detail. The patient agreed to proceed as planned.   PROCEDURE: The patient was correctly identified in the preoperative holding area and informed consent was confirmed. She was brought to the operating suite and placed on the table in supine position. At this time universal timeout protocol was performed. All team members were identified. Venodyne boots were placed and she was administered 500 mg of IV Levaquin in the perioperative period. She was then placed on general anesthesia, repositioned and the lower end of the bed in the dorsal lithotomy position and prepped and draped in the standard surgical fashion. At this point of time, a rigid scope using a 22 French access sheath was advanced per urethra into the bladder. The distal  coil of the right ureteral stent was identified, and grasped using stent graspers and spread out to the level of the urethral meatus.  This was then cannulated using a 0.038 Sensor wire up to the level of the kidney without difficulty, confirmed under fluoroscopic guidance. The stent was then removed leaving the wire in place.  This was snapped in place as a working wire. A semirigid short ureteroscope was then advanced alongside of the wire into the distal ureter up to the level where a small stone fragment was identified. At this point in time, we planned to bring in the laser fiber but the stone seem to follow the ureteroscope as it was withdrawn from the ureter and did pass into the bladder without needing laser lithotripsy. The scope was then readvanced up to the level of the mid proximal ureter where a bifurcation was seen. There were no residual stone fragments identified. The scope was carefully withdrawn and, again, the entire length of the ureter was inspected. There was no perforation, significant edema or any residual stone fragments identified. A 6 x 24 French double-J ureteral stent was advanced over the safety wire up to the level of the lower pole complex without difficulty under fluoroscopic guidance. The wire was withdrawn and the coil was noted within the lower pole calyx. The wire to the completely withdrawn and a coil was noted within the bladder. The scope was then reintroduced into the bladder and the bladder was evacuated.  A small clot within a minute ureteral fragment was evacuated from the bladder but this specimen was so small, it was likely not large enough  for stone analysis. Therefore, it was not isolated. The patient was then cleaned and dried, and the stone from the string was fixed to the patient's left inner thigh using Mastisol and Tegaderm.  The patient was then repositioned in the supine position, reversed from anesthesia and taken to the PACU in stable condition. There were no  complications in this case.   PLAN: The patient will remove her own stent on Thursday and will follow up in clinic in four weeks with a renal ultrasound prior to this.    ____________________________ Claris Gladden, MD ajb:at D: 12/16/2014 15:31:03 ET T: 12/16/2014 17:44:17 ET JOB#: [id number]  cc: Claris Gladden, MD, <Dictator> Claris Gladden MD ELECTRONICALLY SIGNED 12/31/2014 12:35

## 2015-03-18 NOTE — H&P (Signed)
L&D Evaluation:  History Expanded:   HPI 24 yo G1 whose LMP = ?juner.  Pt presents to the ER with the Hx of passing the fetus in the toilet.  W/U in ER includes a pelvic US showing retained POC.  Pt last ate at 9 pm.    Gravida 1    Patient's Medical History Asthma    Patient's Surgical History none    Medications Inhaler    Allergies Hydrocodone    Social History none    Family History Non-Contributory   Exam:   Vital Signs stable    General no apparent distress    Mental Status clear    Chest clear    Heart normal sinus rhythm    Abdomen gravid, non-tender    Edema no edema    Pelvic deferred to OR   Impression:   Impression Retained POC   Plan:   Comments sched D+C, Pt is o negative, will give RhoGAM   Electronic Signatures: Towana Badgerosenow, Amalya Salmons J (MD)  (Signed 10-Apr-13 08:53)  Authored: L&D Evaluation   Last Updated: 10-Apr-13 08:53 by Towana Badgerosenow, Nimrit Kehres J (MD)

## 2015-04-21 ENCOUNTER — Encounter: Payer: Self-pay | Admitting: Emergency Medicine

## 2015-04-21 ENCOUNTER — Ambulatory Visit
Admission: EM | Admit: 2015-04-21 | Discharge: 2015-04-21 | Disposition: A | Discharge status: Home / Self Care | Payer: BLUE CROSS/BLUE SHIELD | Attending: Family Medicine | Admitting: Family Medicine

## 2015-04-21 DIAGNOSIS — H6503 Acute serous otitis media, bilateral: Secondary | ICD-10-CM | POA: Diagnosis not present

## 2015-04-21 HISTORY — DX: Unspecified asthma, uncomplicated: J45.909

## 2015-04-21 HISTORY — DX: Calculus of kidney: N20.0

## 2015-04-21 HISTORY — DX: Encounter for other specified aftercare: Z51.89

## 2015-04-21 HISTORY — DX: Anemia, unspecified: D64.9

## 2015-04-21 MED ORDER — AMOXICILLIN 875 MG PO TABS: 875.0000 mg | ORAL_TABLET | Freq: Two times a day (BID) | ORAL | Status: DC

## 2015-04-21 NOTE — ED Provider Notes (Signed)
CSN: [id number]     Arrival date & time 04/21/15  1319 History   First MD Initiated Contact with Patient 04/21/15 1353     Chief Complaint  Patient presents with  . Facial Pain  . Otalgia  . Nasal Congestion   (Consider location/radiation/quality/duration/timing/severity/associated sxs/prior Treatment) Patient is a 24 y.o. female presenting with ear pain. The history is provided by the patient.  Otalgia Location:  Bilateral Quality:  Aching and pressure Severity:  Mild Onset quality:  Sudden Duration:  4 days Timing:  Constant Progression:  Worsening Chronicity:  New Context: not direct blow, not elevation change, not loud noise and no water in ear   Relieved by:  Nothing Ineffective treatments:  OTC medications Associated symptoms: congestion, cough, fever, headaches and rhinorrhea   Associated symptoms: no diarrhea, no ear discharge, no hearing loss, no neck pain, no rash, no sore throat, no tinnitus and no vomiting   Risk factors: no recent travel and no chronic ear infection     Past Medical History  Diagnosis Date  . Asthma   . Blood transfusion without reported diagnosis   . Kidney stones   . Anemia    History reviewed. No pertinent past surgical history. History reviewed. No pertinent family history. History  Substance Use Topics  . Smoking status: Never Smoker   . Smokeless tobacco: Never Used  . Alcohol Use: No   OB History    No data available     Review of Systems  Constitutional: Positive for fever.  HENT: Positive for congestion, ear pain and rhinorrhea. Negative for ear discharge, hearing loss, sore throat and tinnitus.   Respiratory: Positive for cough.   Gastrointestinal: Negative for vomiting and diarrhea.  Musculoskeletal: Negative for neck pain.  Skin: Negative for rash.  Neurological: Positive for headaches.    Allergies  Percocet  Home Medications   Prior to Admission medications   Medication Sig Start Date End Date Taking?  Authorizing Provider  Ferrous Sulfate (IRON) 325 (65 FE) MG TABS Take 325 mg by mouth 3 (three) times daily.   Yes Historical Provider, MD  levonorgestrel-ethinyl estradiol (SEASONALE,INTROVALE,JOLESSA) 0.15-0.03 MG tablet Take 1 tablet by mouth daily.   Yes Historical Provider, MD  amoxicillin (AMOXIL) 875 MG tablet Take 1 tablet (875 mg total) by mouth 2 (two) times daily. 04/21/15   Payton Mccallum, MD   BP 118/76 mmHg  Pulse 73  Temp(Src) 98.4 F (36.9 C) (Tympanic)  Resp 16  Ht 5' (1.524 m)  Wt 210 lb (95.255 kg)  BMI 41.01 kg/m2  SpO2 100%  LMP 03/18/2015 (Exact Date) Physical Exam  Constitutional: She is oriented to person, place, and time. She appears well-developed and well-nourished. No distress.  HENT:  Head: Normocephalic.  Right Ear: External ear and ear canal normal. Tympanic membrane is injected, erythematous and bulging. A middle ear effusion is present.  Left Ear: External ear and ear canal normal. Tympanic membrane is injected, erythematous and bulging. A middle ear effusion is present.  Nose: Nose normal.  Mouth/Throat: Oropharynx is clear and moist and mucous membranes are normal.  Eyes: Conjunctivae and EOM are normal. Pupils are equal, round, and reactive to light. Right eye exhibits no discharge. Left eye exhibits no discharge. No scleral icterus.  Neck: Normal range of motion. Neck supple. No JVD present. No tracheal deviation present. No thyromegaly present.  Cardiovascular: Normal rate, regular rhythm, normal heart sounds and intact distal pulses.   No murmur heard. Pulmonary/Chest: Effort normal and breath sounds normal. No  stridor. No respiratory distress. She has no wheezes. She has no rales.  Lymphadenopathy:    She has no cervical adenopathy.  Neurological: She is alert and oriented to person, place, and time. She has normal reflexes.  Skin: Skin is warm. No rash noted. She is not diaphoretic.  Psychiatric: She has a normal mood and affect.  Nursing note  and vitals reviewed.   ED Course  Procedures (including critical care time) Labs Review Labs Reviewed - No data to display  Imaging Review No results found.   MDM   1. Bilateral acute serous otitis media, recurrence not specified    Discharge Medication List as of 04/21/2015  2:00 PM    START taking these medications   Details  amoxicillin (AMOXIL) 875 MG tablet Take 1 tablet (875 mg total) by mouth 2 (two) times daily., Starting 04/21/2015, Until Discontinued, Normal      Plan: 1. diagnosis reviewed with patient 2. rx as per orders; risks, benefits, potential side effects reviewed with patient 3. Recommend supportive treatment with otc analgesics and decongestant, steroid nasal spray 4. F/u prn if symptoms worsen or don't improve    Payton Mccallum, MD 04/21/15 [phone]

## 2015-04-21 NOTE — ED Notes (Signed)
Patient c/o sinus pain, and ear pain and runny nose for the past 3-4 days.

## 2016-03-09 IMAGING — CT CT ABD-PELV W/ CM
2 of 5 series · 15 of 46 positions shown, 17 images · IV contrast (omnipaque)
Comparison: CT of the abdomen and pelvis from 09/14/2009

CLINICAL DATA: Acute onset of right-sided abdominal and pelvic
pain. Nausea and vomiting. Initial encounter.

EXAM:
CT ABDOMEN AND PELVIS WITH CONTRAST
TECHNIQUE: Multidetector CT imaging of the abdomen and pelvis was performed
using the standard protocol following bolus administration of
intravenous contrast.
CONTRAST:  100 mL of Omnipaque 350 IV contrast

[Series 2: routine abd pel with · axial · 0.71mm/px · z∈[-475,-55]mm · 12 of 94 slices shown, 14 images]
[im 5/94  soft-tissue]
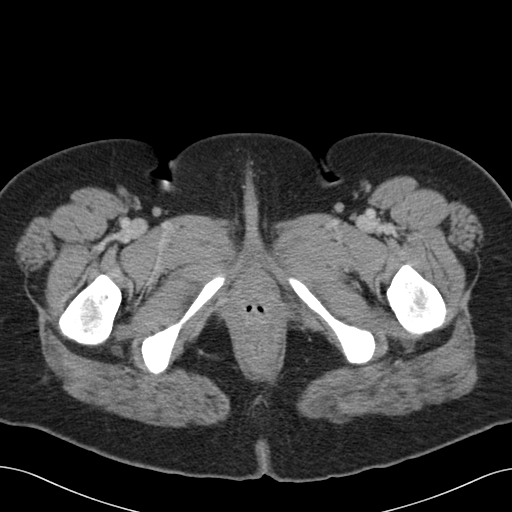
[im 5/94  bone]
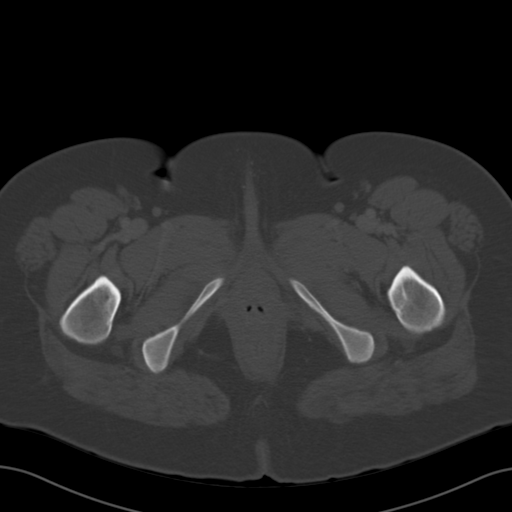
[im 15/94  soft-tissue]
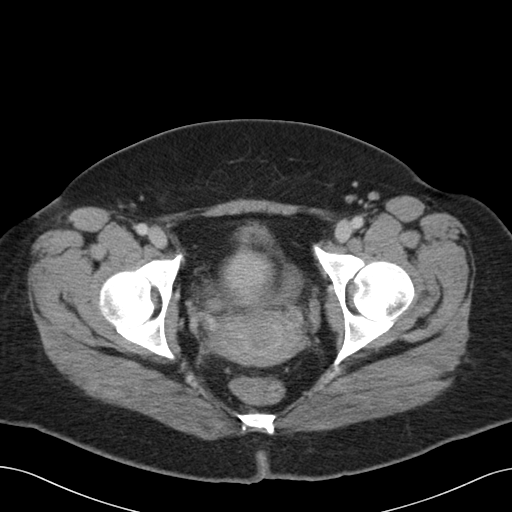
[im 20/94  soft-tissue]
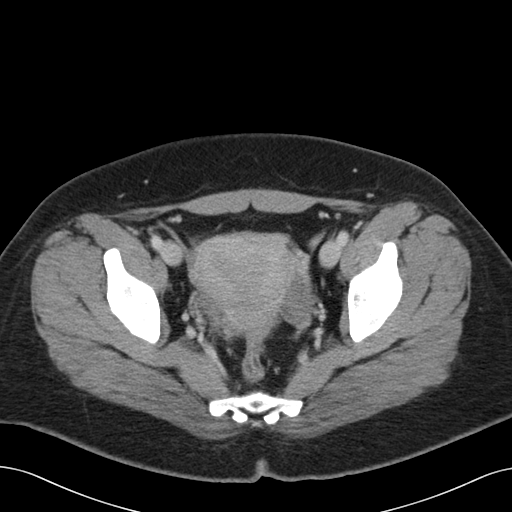
[im 30/94  soft-tissue]
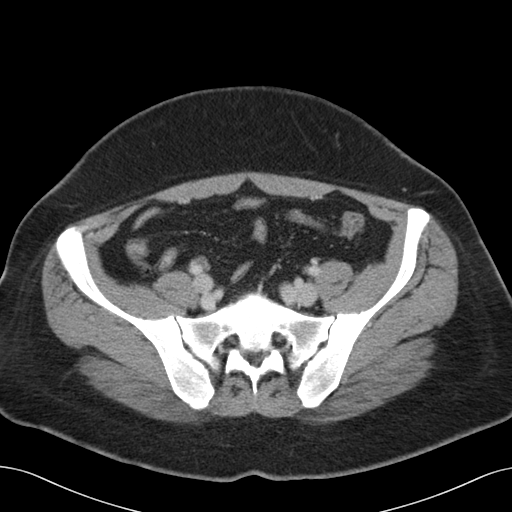
[im 35/94  soft-tissue]
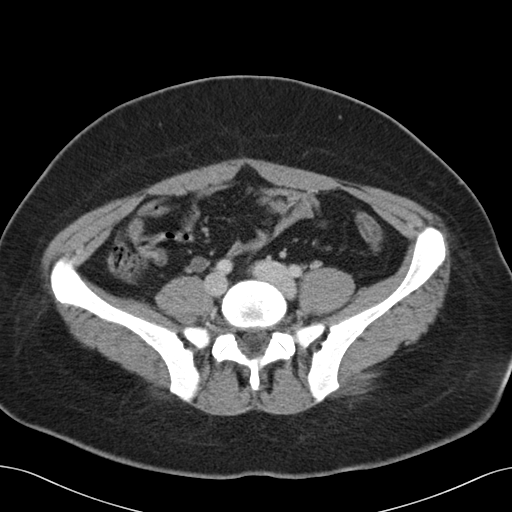
[im 45/94  soft-tissue]
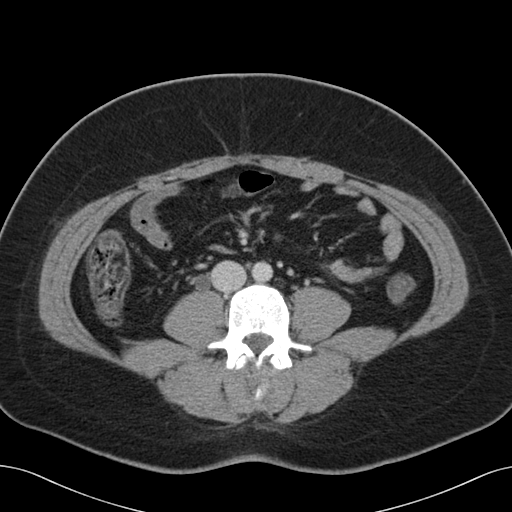
[im 49/94  soft-tissue]
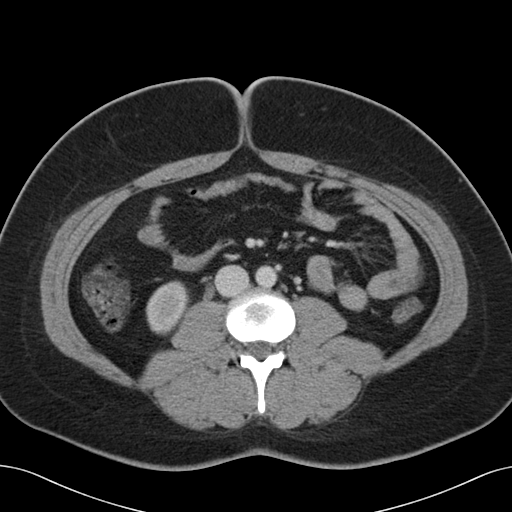
[im 59/94  soft-tissue]
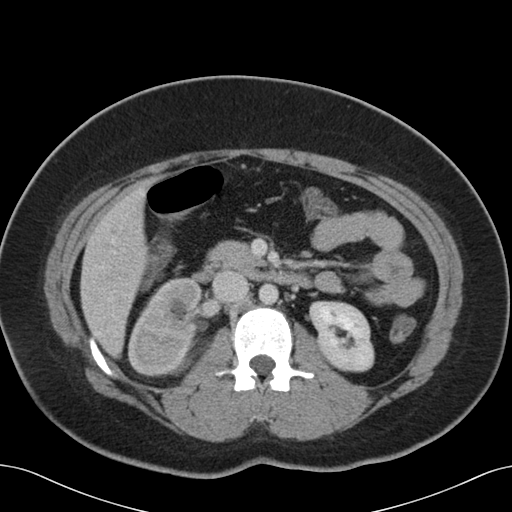
[im 64/94  soft-tissue]
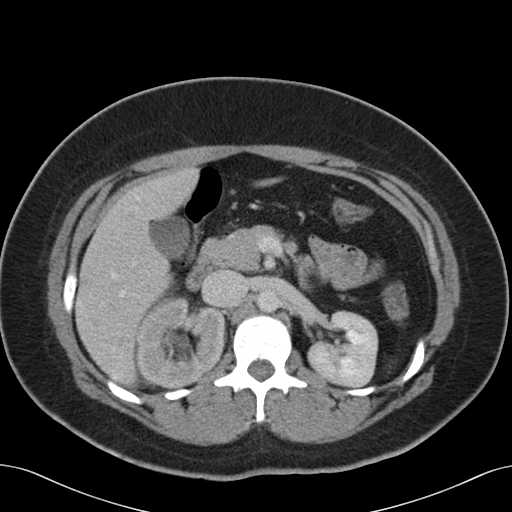
[im 64/94  bone]
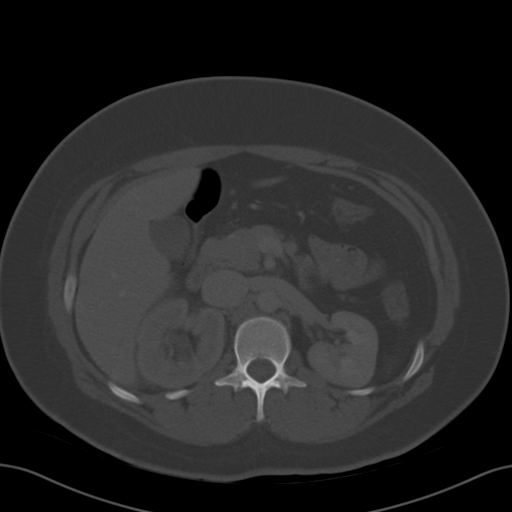
[im 74/94  soft-tissue]
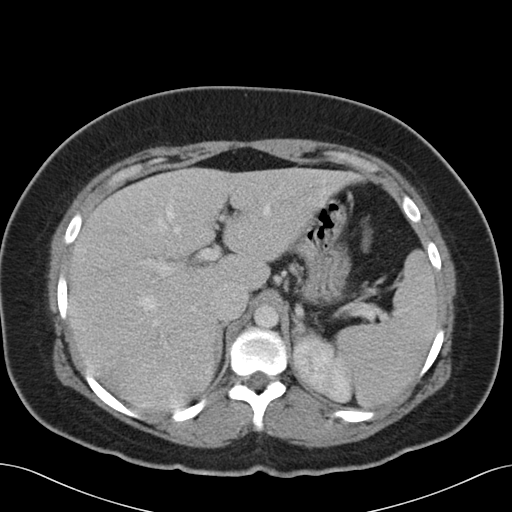
[im 79/94  soft-tissue]
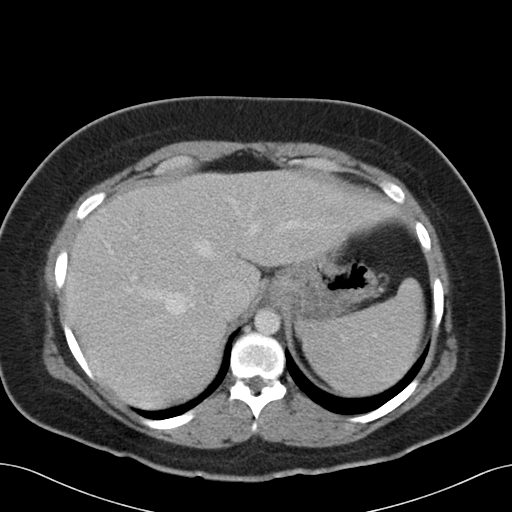
[im 89/94  soft-tissue]
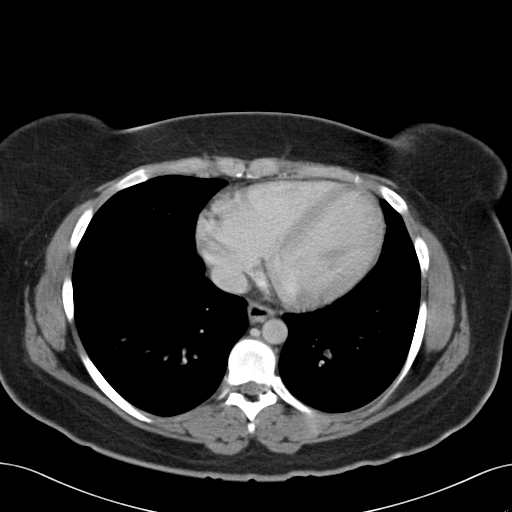

[Series 5: cor routine abd pel with · coronal · 0.69mm/px · 3 of 134 slices shown]
[im 45/134  soft-tissue]
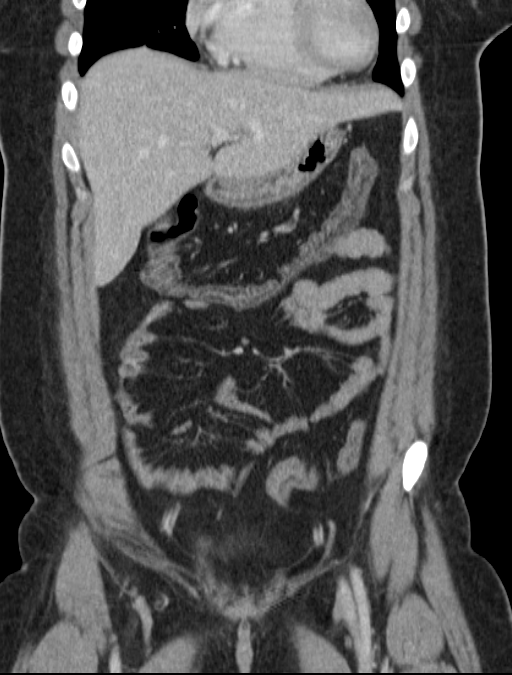
[im 60/134  soft-tissue]
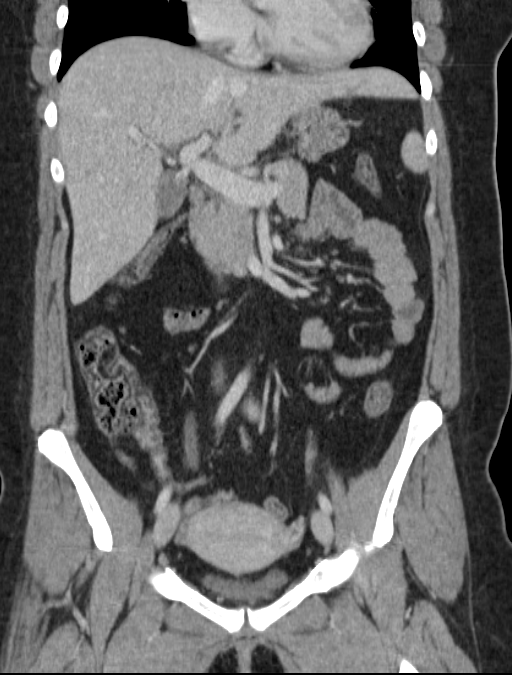
[im 74/134  soft-tissue]
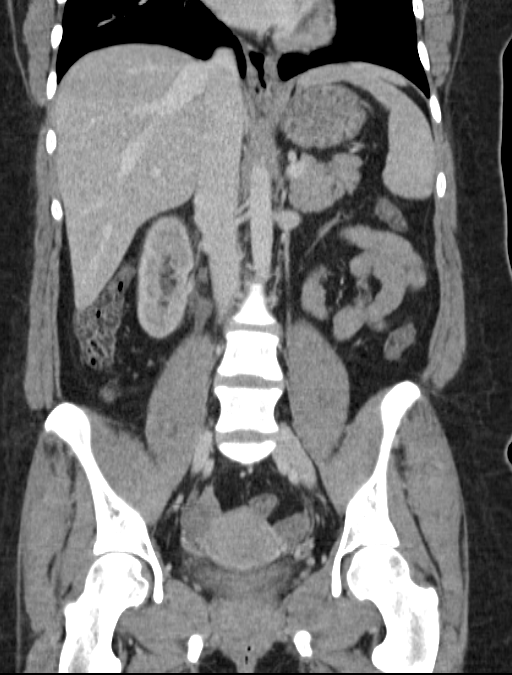

[15 of 46 positions shown; findings below may reference images not displayed]

FINDINGS: The visualized lung bases are clear. There is mild wall thickening
at the distal esophagus. Would correlate clinically for history
relating to Barrett's esophagus.

The liver and spleen are unremarkable in appearance. The gallbladder
is within normal limits. The pancreas and adrenal glands are
unremarkable.

There is mild right-sided hydronephrosis, with prominence of the
right ureter to the level of an obstructing 4 mm stone distally,
near the right vesicoureteral function. There is mild associated
soft tissue inflammation about the level of the stone. There appear
to be two ureters arising from upper and lower pole moieties of the
right kidney, though these merge relatively proximally. There is
mildly decreased enhancement of the right kidney, with hazy
surrounding perinephric stranding. Underlying pyelonephritis cannot
be excluded.

The left kidney is unremarkable in appearance. No nonobstructing
renal stones are identified.

No free fluid is identified. The proximal small bowel is
unremarkable in appearance. The stomach is within normal limits. No
acute vascular abnormalities are seen.

The appendix is normal in caliber, without evidence for
appendicitis. Mild apparent fatty infiltration within the wall of
the entire colon may reflect chronic sequelae of inflammation. There
may also be mild fatty infiltration within the wall of the distal
ileum, difficult to fully assess due to decompression.

The bladder is decompressed and not well assessed. The uterus is
unremarkable in appearance. The ovaries are relatively symmetric. No
suspicious adnexal masses are seen. No inguinal lymphadenopathy is
seen.

No acute osseous abnormalities are identified.
IMPRESSION: 1. Mild right-sided hydronephrosis, with an obstructing 4 mm stone
in the distal right ureter, near the right vesicoureteral junction.
Mild soft tissue inflammation about the level of the stone. Two
right-sided ureters merge relatively proximally.
2. Mildly decreased right renal enhancement, with hazy surrounding
perinephric stranding. Would correlate clinically for evidence of
pyelonephritis.
3. Mild wall thickening at the distal esophagus. Would correlate for
history relating to Barrett's esophagus.
4. Mild apparent fatty infiltration within the wall of the entirety
of the colon may reflect chronic sequelae of prior inflammation.
Suggestion of mild fatty infiltration within the wall of the distal
ileum, difficult to fully assess due to decompression.

## 2022-01-28 DIAGNOSIS — R480 Dyslexia and alexia: Secondary | ICD-10-CM | POA: Insufficient documentation

## 2022-02-01 ENCOUNTER — Other Ambulatory Visit (HOSPITAL_COMMUNITY): Payer: Self-pay | Admitting: Family Medicine

## 2022-02-01 ENCOUNTER — Other Ambulatory Visit: Payer: Self-pay | Admitting: Family Medicine

## 2022-02-01 DIAGNOSIS — R1011 Right upper quadrant pain: Secondary | ICD-10-CM

## 2022-02-01 DIAGNOSIS — N911 Secondary amenorrhea: Secondary | ICD-10-CM

## 2022-02-11 ENCOUNTER — Ambulatory Visit
Admission: RE | Admit: 2022-02-11 | Discharge: 2022-02-11 | Disposition: A | Discharge status: Home / Self Care | Payer: 59 | Source: Ambulatory Visit | Attending: Family Medicine | Admitting: Family Medicine

## 2022-02-11 DIAGNOSIS — R1011 Right upper quadrant pain: Secondary | ICD-10-CM | POA: Diagnosis present

## 2022-02-11 DIAGNOSIS — N911 Secondary amenorrhea: Secondary | ICD-10-CM | POA: Diagnosis present

## 2022-04-06 DIAGNOSIS — K760 Fatty (change of) liver, not elsewhere classified: Secondary | ICD-10-CM | POA: Insufficient documentation

## 2022-04-08 DIAGNOSIS — R7303 Prediabetes: Secondary | ICD-10-CM | POA: Insufficient documentation

## 2022-08-26 DIAGNOSIS — R4689 Other symptoms and signs involving appearance and behavior: Secondary | ICD-10-CM | POA: Insufficient documentation

## 2022-08-26 DIAGNOSIS — K580 Irritable bowel syndrome with diarrhea: Secondary | ICD-10-CM | POA: Insufficient documentation

## 2022-08-26 DIAGNOSIS — K529 Noninfective gastroenteritis and colitis, unspecified: Secondary | ICD-10-CM | POA: Insufficient documentation

## 2022-08-26 DIAGNOSIS — L209 Atopic dermatitis, unspecified: Secondary | ICD-10-CM | POA: Insufficient documentation

## 2022-10-23 ENCOUNTER — Ambulatory Visit
Admission: EM | Admit: 2022-10-23 | Discharge: 2022-10-23 | Disposition: A | Discharge status: Home / Self Care | Payer: 59

## 2022-10-23 DIAGNOSIS — J0101 Acute recurrent maxillary sinusitis: Secondary | ICD-10-CM

## 2022-10-23 DIAGNOSIS — E669 Obesity, unspecified: Secondary | ICD-10-CM | POA: Insufficient documentation

## 2022-10-23 DIAGNOSIS — H66003 Acute suppurative otitis media without spontaneous rupture of ear drum, bilateral: Secondary | ICD-10-CM | POA: Diagnosis not present

## 2022-10-23 MED ORDER — PREDNISONE 20 MG PO TABS: 40.0000 mg | ORAL_TABLET | Freq: Every day | ORAL | 0 refills | Status: AC

## 2022-10-23 MED ORDER — AMOXICILLIN-POT CLAVULANATE 400-57 MG/5ML PO SUSR: 875.0000 mg | Freq: Two times a day (BID) | ORAL | 0 refills | Status: AC

## 2022-10-23 NOTE — ED Triage Notes (Signed)
Patient presents to UC for cough, fever, HA, and chest congestion x 7 days. Treating symptoms with ibuprofen, dayquil, nyquil, and mucinex.

## 2022-10-23 NOTE — Discharge Instructions (Signed)
Antibiotics as prescribed Prednisone daily Flonase daily Nasal rinses as tolerated Rest and fluids Follow-up with PCP 2 to 3 days for recheck ER for any worsening symptoms

## 2022-10-23 NOTE — ED Provider Notes (Signed)
[facility]    CSN: [id number] Arrival date & time: 10/23/22  1428      History   Chief Complaint Chief Complaint  Patient presents with   Cough    Head and chest congestion sinus headache pain in teeth. - Entered by patient    HPI Ashley [name] is a 31 y.o. female  presents for evaluation of URI symptoms for 7 days. Patient reports associated symptoms of sinus pressure, pain with purulent nasal discharge, fevers of 101, aching teeth, ear pain, postnasal drip. Denies N/V/D, cough, shortness of breath. Patient does have a hx of asthma. No smoking. No known sick contacts and no recent travel. Pt is  vaccinated for COVID. Pt is  vaccinated for flu this season. Pt has taken DayQuil/NyQuil and ibuprofen OTC for symptoms. Pt has no other concerns at this time.    Cough Associated symptoms: ear pain and fever     Past Medical History:  Diagnosis Date   Anemia    Asthma    Blood transfusion without reported diagnosis    Kidney stones     Patient Active Problem List   Diagnosis Date Noted   Obesity 10/23/2022   Atopic dermatitis 08/26/2022   Behavior concern 08/26/2022   Chronic diarrhea 08/26/2022   Irritable bowel syndrome with diarrhea 08/26/2022   Prediabetes 04/08/2022   Fatty liver 04/06/2022   Dyslexia 01/28/2022   Barrett's esophagus without dysplasia 12/09/2014   Calculus of kidney 12/09/2014   Pyelonephritis 12/09/2014    History reviewed. No pertinent surgical history.  OB History   No obstetric history on file.      Home Medications    Prior to Admission medications   Medication Sig Start Date End Date Taking? Authorizing Provider  amoxicillin-clavulanate (AUGMENTIN) 400-57 MG/5ML suspension Take 10.9 mLs (875 mg total) by mouth 2 (two) times daily for 10 days. 10/23/22 11/02/22 Yes Radford Pax, NP  dicyclomine (BENTYL) 10 MG capsule Take by mouth. 08/26/22 08/26/23 Yes [provider]  predniSONE (DELTASONE) 20 MG tablet Take  2 tablets (40 mg total) by mouth daily with breakfast for 5 days. 10/23/22 10/28/22 Yes Radford Pax, NP  tranexamic acid (LYSTEDA) 650 MG TABS tablet TAKE 2 TABLETS(1300 MG) BY MOUTH THREE TIMES DAILY. MAXIMUM OF 5 DAYS DURING MONTHLY MENSTRUATION 09/15/22  Yes [provider]  triamcinolone cream (KENALOG) 0.1 % Apply topically 2 (two) times daily. 08/26/22 08/26/23 Yes [provider]  baclofen (LIORESAL) 20 MG tablet Take by mouth.    [provider]  Ferrous Sulfate (IRON) 325 (65 FE) MG TABS Take 325 mg by mouth 3 (three) times daily.    [provider]  levonorgestrel-ethinyl estradiol (SEASONALE,INTROVALE,JOLESSA) 0.15-0.03 MG tablet Take 1 tablet by mouth daily.    [provider]    Family History History reviewed. No pertinent family history.  Social History Social History   Tobacco Use   Smoking status: Never   Smokeless tobacco: Never  Substance Use Topics   Alcohol use: No   Drug use: No     Allergies   Menthol and Percocet [oxycodone-acetaminophen]   Review of Systems Review of Systems  Constitutional:  Positive for fever.  HENT:  Positive for congestion, ear pain, sinus pressure and sinus pain.      Physical Exam Triage Vital Signs ED Triage Vitals  Enc Vitals Group     BP 10/23/22 1553 (!) 132/93     Pulse Rate 10/23/22 1553 81     Resp  10/23/22 1553 18     Temp 10/23/22 1553 97.8 F (36.6 C)     Temp Source 10/23/22 1553 Temporal     SpO2 10/23/22 1553 100 %     Weight --      Height --      Head Circumference --      Peak Flow --      Pain Score 10/23/22 1555 7     Pain Loc --      Pain Edu? --      Excl. in GC? --    No data found.  Updated Vital Signs BP (!) 132/93 (BP Location: Left Arm)   Pulse 81   Temp 97.8 F (36.6 C) (Temporal)   Resp 18   LMP 05/24/2022 (Approximate)   SpO2 100%   Visual Acuity Right Eye Distance:   Left Eye Distance:   Bilateral Distance:    Right Eye Near:    Left Eye Near:    Bilateral Near:     Physical Exam Vitals and nursing note reviewed.  Constitutional:      General: She is not in acute distress.    Appearance: She is well-developed. She is not ill-appearing.  HENT:     Head: Normocephalic and atraumatic.     Right Ear: Ear canal normal. Tympanic membrane is erythematous.     Left Ear: Ear canal normal. Tympanic membrane is erythematous.     Nose: Congestion present.     Right Turbinates: Swollen and pale.     Left Turbinates: Swollen and pale.     Right Sinus: Maxillary sinus tenderness present.     Left Sinus: Maxillary sinus tenderness present.     Mouth/Throat:     Mouth: Mucous membranes are moist.     Pharynx: Oropharynx is clear. Uvula midline. No oropharyngeal exudate or posterior oropharyngeal erythema.     Tonsils: No tonsillar exudate or tonsillar abscesses.  Eyes:     Conjunctiva/sclera: Conjunctivae normal.     Pupils: Pupils are equal, round, and reactive to light.  Cardiovascular:     Rate and Rhythm: Normal rate and regular rhythm.     Heart sounds: Normal heart sounds.  Pulmonary:     Effort: Pulmonary effort is normal.     Breath sounds: Normal breath sounds.  Musculoskeletal:     Cervical back: Normal range of motion and neck supple.  Lymphadenopathy:     Cervical: No cervical adenopathy.  Skin:    General: Skin is warm and dry.  Neurological:     General: No focal deficit present.     Mental Status: She is alert and oriented to person, place, and time.  Psychiatric:        Mood and Affect: Mood normal.        Behavior: Behavior normal.      UC Treatments / Results  Labs (all labs ordered are listed, but only abnormal results are displayed) Labs Reviewed - No data to display  EKG   Radiology No results found.  Procedures Procedures (including critical care time)  Medications Ordered in UC Medications - No data to display  Initial Impression / Assessment and Plan / UC Course  I  have reviewed the triage vital signs and the nursing notes.  Pertinent labs & imaging results that were available during my care of the patient were reviewed by me and considered in my medical decision making (see chart for details).     Start Augmentin.  Patient requested liquid Prednisone  daily Flonase daily Nasal rinses as tolerated Rest and fluids PCP follow-up 2 to 3 days for recheck ER for any worsening symptoms Final Clinical Impressions(s) / UC Diagnoses   Final diagnoses:  Acute recurrent maxillary sinusitis  Acute suppurative otitis media of both ears without spontaneous rupture of tympanic membranes, recurrence not specified     Discharge Instructions      Antibiotics as prescribed Prednisone daily Flonase daily Nasal rinses as tolerated Rest and fluids Follow-up with PCP 2 to 3 days for recheck ER for any worsening symptoms   ED Prescriptions     Medication Sig Dispense Auth. Provider   amoxicillin-clavulanate (AUGMENTIN) 400-57 MG/5ML suspension Take 10.9 mLs (875 mg total) by mouth 2 (two) times daily for 10 days. 100 mL Radford Pax, NP   predniSONE (DELTASONE) 20 MG tablet Take 2 tablets (40 mg total) by mouth daily with breakfast for 5 days. 10 tablet Radford Pax, NP      PDMP not reviewed this encounter.   Radford Pax, NP 10/23/22 [phone]

## 2022-10-28 NOTE — Progress Notes (Signed)
 Chief Complaint  Patient presents with  . Annual Exam    Subjective:   Ashley [name] is a 31 y.o. female here for a health maintenance visit.  Patient is an established pt Additional concerns addressed today:   HPI   Insomnia, unspecified type She has difficulty not going to sleep but staying asleep.  She will probably get approximately 4 hours of sleep does not interrupted.  She is not certain as to what it could be that may be causing this.  Positive for also stressors at work.  She has used over-the-counter sleep aids without significant relief including melatonin.  Class 2 obesity due to excess calories without serious comorbidity with body mass index (BMI) of 39.0 to 39.9 in adult Not currently exercising.  She only eats once a day as she has started to do intermittent fasting but she is not losing any weight.  She only eats at night.  She drinks plenty of water not high in sugar beverages.  Not currently exercising. Prediabetes Patient has prediabetes.  She has not had polyuria polyphagia polydipsia.  Positive family history of first-degree relative such as her mom who has diabetes.  She is not currently taking medication of her diabetes patient not currently exercising. Barrett's esophagus without dysplasia There is an underlying history of Barrett's esophagus without dysplasia.  She has been previously referred to GI however she states she is having difficulty scheduling appointment.      Patient Active Problem List  Diagnosis  . Obesity  . Nephrolithiasis  . Renal calculi  . Pyelonephritis  . Barrett's esophagus without dysplasia  . Dyslexia  . Fatty liver  . Prediabetes  . Irritable bowel syndrome with diarrhea  . Atopic dermatitis  . Behavior concern  . Chronic diarrhea    Outpatient Medications Prior to Visit  Medication Sig Dispense Refill  . amoxicillin -clavulanate (AUGMENTIN ) 400-57 mg/5 mL suspension Take 875 mg by mouth 2 (two) times daily    . dicyclomine  (BENTYL) 10 mg capsule Take 1 capsule (10 mg total) by mouth 3 (three) times daily before meals 90 capsule 11  . L norgest/e.estradioL-e.estrad 0.15 mg-30 mcg (84)/10 mcg (7) Take 1 tablet by mouth once daily 90 tablet 3  . medroxyPROGESTERone (PROVERA) 10 MG tablet Take 1 tablet (10 mg total) by mouth once daily for 10 days 10 tablet 0  . multivitamin tablet Take 1 tablet by mouth once daily    . tranexamic acid (LYSTEDA) 650 mg tablet TAKE 2 TABLETS(1300 MG) BY MOUTH THREE TIMES DAILY. MAXIMUM OF 5 DAYS DURING MONTHLY MENSTRUATION 30 tablet 2  . triamcinolone 0.1 % cream Apply topically 2 (two) times daily 80 g 0  . baclofen (LIORESAL) 20 MG tablet Take 20 mg by mouth once as needed. (Patient not taking: Reported on 10/28/2022)    . gabapentin (NEURONTIN) 100 MG capsule Take 2 capsules (200 mg total) by mouth nightly (Patient not taking: Reported on 08/26/2022) 60 capsule 11  . predniSONE  (DELTASONE ) 20 MG tablet Take by mouth (Patient not taking: Reported on 10/28/2022)     No facility-administered medications prior to visit.       10/28/2022  Social Factors  How hard is it for you to pay for the very basics like food, housing, medical care, and heating? Not very  Within the past 12 months, you worried that your food would run out before you got the money to buy more. Sometimes  Within the past 12 months, the food you bought just didn't last  and you didn't have money to get more. Never true  In the past 12 months, has lack of transportation kept you from medical appointments or from getting medications? no  In the past 12 months, has lack of transportation kept you from meetings, work, or from getting things needed for daily living? No  In the last 12 months, was there a time when you were not able to pay the mortgage or rent on time? N  In the last 12 months, how many places have you lived? 1  In the last 12 months, was there a time when you did not have a steady place to sleep or slept in  a shelter (including now)? N      10/28/2022  [employer] Authorization for Release of Information - [employer]   Are any of your needs urgent? No  Would you like help with any of the needs that you have identified? No  Permission to provide our community partners with your name, address, and phone number so they can contact you Decline    Health Habits: Alcohol Use: Not on file   Tobacco Use: Low Risk  (10/28/2022)   Patient History   . Smoking Tobacco Use: Never   . Smokeless Tobacco Use: Never   . Passive Exposure: Not on file   Exercise: 0 times/week on average Current exercise activities: none Diet: well balanced Dental Exam: up to date Eye Exam: up to date  GYN: Sexual Health Menstrual status: premenarchal LMP: Patient's last menstrual period was 05/26/2022. Menses: regular Last pap smear: see HM section History of abnormal pap smears: No Sexually active: No with female partner Current contraception: OCP (estrogen/progesterone)  Health Maintenance: See under health Maintenance activity for review of completion dates as well. Immunization History  Administered Date(s) Administered  . Hepatitis B, unspecified 04/19/1991, 07/03/1991, 12/25/1992  . VAR  (>=48MO) VACCINE (VARIVAX) 08/29/1998    Health Maintenance Topics with due status: Overdue     Topic Date Due   Varicella Vaccines 11/21/1998   Health Maintenance Topics with due status: Due Soon     Topic Date Due   Pap Smear 11/22/2022   Health Maintenance Topics with due status: Postponed     Topic Postponed Until   COVID-19 Vaccine 04/09/2023 (Originally 08/19/1991)   Adult Tetanus (Td And Tdap) 04/09/2023 (Originally 09/09/2019)   Health Maintenance Topics with due status: Not Due     Topic Last Completion Date   Lipid Panel 10/28/2022   Diabetes Screening 10/28/2022   Depression Screening 10/28/2022   Annual Visit/Physical/Well Child Check 10/28/2022   Health Maintenance Topics with due status:  Completed     Topic Last Completion Date   HIV Screen 11/22/2014   Health Maintenance Topics with due status: Aged Out     Topic Date Due   Hib Vaccines Aged Out   Hepatitis A Vaccines Aged Out   Meningococcal ACWY Vaccine Aged Out   Pneumococcal Vaccine Aged Out   HPV Vaccines Aged Out   Health Maintenance Topics with due status: Discontinued     Topic Date Due   Hepatitis C Screen Discontinued   Influenza Vaccine Discontinued    Depression Screen-PHQ2/9 PHQ-2 PHQ-2 Over the last 2 weeks, how often have you been bothered by any of the following problems? Little interest or pleasure in doing things: More Than Half the Days Feeling down, depressed, or hopeless (or irritable for Teens only)?: Several Days Total Prescreening Score: 3  PHQ-9 (if PHQ >=3) PHQ-9 Over the last 2  weeks, how often have you been bothered by any of the following problems? Trouble falling or staying asleep, or sleeping too much?: Nearly every day Feeling tired or having little energy?: More than half the days Poor appetite or overeating?: Nearly every day Feeling bad about yourself - or that you are a failure or have let yourself or your family down?: Several days Trouble concentrating on things, such as reading the newspaper or watching television?: Nearly every day Moving or speaking so slowly that other people could have noticed?  Or the opposite - being so fidgety or restless that you have been moving around a lot more than usual?: Several days Thoughts that you would be better off dead, or of hurting yourself in some way?: Not at all Total Score =: 16  PHQ-2 Interpretation Values between 0-3 are considered not significant for depression  PHQ-9 Interpretation and Treatment Recommendations:  0-4= None  5-9= Mild / Treatment: Support, educate to call if worse; return in one month  10-14= Moderate / Treatment: Support, watchful waiting; Antidepressant or Psychotherapy  15-19= Moderately severe /  Treatment: Antidepressant OR Psychotherapy  >= 20 = Major depression, severe / Antidepressant AND Psychotherapy  ROS   Objective:   Vitals:   10/28/22 0951  BP: 107/75  Pulse: 78  Temp: 36.7 C (98 F)  Weight: (!) 104.8 kg (231 lb)  Height: 165.1 cm (5' 5)  PainSc:   2   Body mass index is 38.44 kg/m. Home vitals:     Physical Exam  Constitutional: alert and in NAD Conversation: normal and appropriate HEENT: EOMI, external ear canals normal, tympanic membranes normal, and pupils equal and round Skin: normal Oropharynx: mucous membranes moist Neck: supple and no thyroid enlargement or cervical adenopathy Respiratory: clear to auscultation, without rales or wheezes Cardiovascular: regular rate and rhythm and without murmurs, rubs or gallops Abdomen: soft, nontender, nondistended, and no hepatosplenomegaly Musculoskeletal: age appropriate ROM of shoulders, hips and knees Extremities: no lower extremity edema, dorsalis pedis pulses normal Neurological: grossly intact and normal gait  Breast: normal appearance, no masses or tenderness, No nipple retraction or dimpling, No axillary or supraclavicular adenopathy Genitalia: Exam deferred.   Assessment/Plan:  Diagnoses and all orders for this visit:  Routine general medical examination at health care facility   The following issues were addressed today for health maintenance: Diet - reviewed healthy diet and weight management Diet - discussed weight loss strategies Discussed importance of aerobic exercise and strength training Encouraged to schedule routine dental follow up Encouraged to schedule routine eye check Follow up for annual exam in one year Recommended tobacco cessation Recommended sunscreen usage Women's Health - completed breast exam Women's Health - birth control options discussed -  Follow up for annual exam in one year  The patient's BMI is above the acceptable range; discussed or provided materials  on diet/exercise   Insomnia, unspecified type Start traZODone (DESYREL) 50 MG tablet; Take 1 tablet (50 mg total) by mouth at bedtime For insomnia Rtc in 4 weeks Major depressive disorder Started on trazodone 50 mg 1 tab by mouth p.o. nightly She will follow-up with me in 4 weeks  Class 2 obesity due to excess calories without serious comorbidity with body mass index (BMI) of 39.0 to 39.9 in adult Consume a low carbohydrate diet along with exercise 3 to 5 days a week for 30 minutes to an hour Stretch before any activity Recommend 1600 -calorie/day diet Prediabetes -     Hemoglobin A1C; Future -  Comprehensive Metabolic Panel (CMP); Future Return to clinic as needed consume a low carbohydrate diet with exercise. Barrett's esophagus without dysplasia Bided her the contact number that is listed in her chart to contact gastroenterology to schedule appoint with GI as she does not need a referral. Screening for cardiovascular condition -     Lipid Panel W/Reflex Direct Low Density Lipoprotein (LDL) Cholesterol; Future Rtc, prn Screening for iron deficiency anemia -     Complete Blood Count (CBC); Future Rtc, prn    This visit was coded based on medical decision making (MDM).   Portions of this note were generated with voice recognition software (DragonMedicalOne dictation software / speech recognition software/DAX) and may contain unintended transcription errors as interpreted by the software. I apologize for any  typographical errors that were not detected and corrected.         Wt Readings from Last 3 Encounters:  10/28/22 (!) 104.8 kg (231 lb)  08/26/22 (!) 106.5 kg (234 lb 12.8 oz)  04/08/22 (!) 105.7 kg (233 lb 0.4 oz)           Future Appointments     Date/Time Provider Department Center Visit Type   11/01/2023 10:00 AM (Arrive by 9:45 AM) Ashley Idelia Sherita Jock, MD [facility] PHYSICAL       An after visit summary was provided  for the patient either in written format or through MyChart.

## 2024-06-21 NOTE — Progress Notes (Addendum)
 Assessment/Plan     AUB/HMB/Dysmenorrhea w/ concern for PCOS Irregular, heavy and painful bleeding since menarche. Primary vs secondary dysmenorrhea I.e., endometriosis, adenomyosis.  Prior medical management has included DMPA. - review the histology from the EMB - testosterone, prolactin, and 17-hydroxyprogesterone levels ordered - Start aygestin PO qd  - Education materials provided - Return prn or in 6 mos   A student assisted me with documenting this service. I saw the patient and reviewed and verified all information documented by the student.  I have also documented a note including the HPI, physical exam and assessment/plan.     I personally spent 30 minutes face-to-face and non-face-to-face in the care of this patient, which includes all pre, intra, and post visit time on the date of service.  All documented time was specific to the E/M visit and does not include any procedures that may have been performed.       Patient ID: Ashley [name], female   DOB: [birth year], 33 y.o.   MRN: [id number]  Chief Complaint  Patient presents with  . Gynecologic Exam    HPI Ashley [name] is a 33 y.o. female.     She is seen at the request of Alyse Slater Pao, MD.  She reports symtpoms of irregular menstruation and menorrhagia with periods lasting 3 days to 3 months while using 12-15 tampons (ultra) on her heaviest day. She reports 7-10/10 stabbing pain in ovarian area and passage of clots up to the size of a tangerine. She reports the her last period starting in 6/20 and lasting for one month. Reports having a miscarriage 2013 apr 10. She has tried txa bid prn which some times helps with the bleeding but does not ease the pain. She has also tried depo-provera which helped ease the pain significantly and stopped the bleeding but had significant effects after stopping the medication and has not tried again since. Her sxs have been refractory to COCPs, NSAIDS.  She also reports associated dizziness,  weakness, and n/v.   Review of prior data; 6/25: TSH/CBC in range TVUS 4/23: Endometrial stripe is prominent without increased vascularity   Review of Systems Review of Systems Constitutional: negative for fatigue and weight loss Respiratory: negative for cough and wheezing Cardiovascular: negative for chest pain, fatigue and palpitations Gastrointestinal: negative for abdominal pain and change in bowel habits Genitourinary:negative for abnormal discharge Integument/breast: negative for nipple discharge Musculoskeletal:negative for myalgias Neurological: negative for gait problems and tremors Behavioral/Psych: negative for abusive relationship, depression Endocrine: negative for temperature intolerance      The following portions of the patient's history were reviewed and updated as appropriate: allergies, current medications, past family history, past medical history, past social history, past surgical history, and problem list.  Blood pressure 117/81, pulse 72, weight (!) 109.3 kg (240 lb 14.4 oz), last menstrual period 04/16/2024.  Physical Exam Physical Exam The sensitive parts of the examination were performed with a chaperone. If medical students were involved, explicit additional consent was obtained.  General:   alert  Skin:   no rash or abnormalities  Lungs:   clear to auscultation bilaterally  Heart:   regular rate and rhythm, S1, S2 normal, no murmur, click, rub or gallop  Abdomen:  normal findings: no organomegaly, soft, non-tender and no hernia  Pelvis:  External genitalia: normal general appearance Urinary system: urethral meatus normal and bladder without fullness, nontender Vaginal: normal without tenderness, induration or masses Cervix: normal appearance Adnexa: normal bimanual exam Uterus: anteverted and non-tender, normal size  Endometrial Biopsy Procedure Note  Pre-operative Diagnosis: AUB--O  Post-operative Diagnosis: same  Indications: abnormal  uterine bleeding  Procedure Details   Urine pregnancy test was done and result was neg.  The risks (including infection, bleeding, pain, and uterine perforation) and benefits of the procedure were explained to the patient and Written informed consent was obtained.     The patient was placed in the dorsal lithotomy position.  Bimanual exam showed the uterus to be in the anteroflexed position.  A Graves' speculum inserted in the vagina, and the cervix prepped with povidone iodine.     A sharp tenaculum was applied to the anterior lip of the cervix for stabilization.  A sterile uterine sound was used to sound the uterus to a depth of 7.5 cm.  A Pipelle endometrial aspirator was used to sample the endometrium.  Sample was sent for pathologic examination.  Condition: Stable  Complications: None  Plan:  The patient was advised to call for any fever or for prolonged or severe pain or bleeding. She was advised to use OTC analgesics as needed for mild to moderate pain. She was advised to avoid vaginal intercourse for 48 hours or until the bleeding has completely stopped.

## 2024-06-28 NOTE — Telephone Encounter (Signed)
 Needs phone/VVV to review lab results.

## 2024-09-28 ENCOUNTER — Emergency Department: Admission: EM | Admit: 2024-09-28 | Discharge: 2024-09-28 | Disposition: A | Discharge status: Home / Self Care

## 2024-09-28 ENCOUNTER — Other Ambulatory Visit: Payer: Self-pay

## 2024-09-28 ENCOUNTER — Encounter: Payer: Self-pay | Admitting: Intensive Care

## 2024-09-28 ENCOUNTER — Emergency Department

## 2024-09-28 DIAGNOSIS — R1030 Lower abdominal pain, unspecified: Secondary | ICD-10-CM | POA: Insufficient documentation

## 2024-09-28 DIAGNOSIS — R109 Unspecified abdominal pain: Secondary | ICD-10-CM | POA: Diagnosis not present

## 2024-09-28 DIAGNOSIS — N939 Abnormal uterine and vaginal bleeding, unspecified: Secondary | ICD-10-CM | POA: Insufficient documentation

## 2024-09-28 DIAGNOSIS — R42 Dizziness and giddiness: Secondary | ICD-10-CM | POA: Insufficient documentation

## 2024-09-28 HISTORY — DX: Endometriosis, unspecified: N80.9

## 2024-09-28 LAB — TYPE AND SCREEN
ABO/RH(D): O NEG
Antibody Screen: NEGATIVE

## 2024-09-28 LAB — URINALYSIS, ROUTINE W REFLEX MICROSCOPIC
Bilirubin Urine: NEGATIVE
Glucose, UA: NEGATIVE mg/dL
Ketones, ur: NEGATIVE mg/dL
Leukocytes,Ua: NEGATIVE
Nitrite: NEGATIVE
Protein, ur: NEGATIVE mg/dL
RBC / HPF: >50 RBC/hpf (ref 0–5)
Specific Gravity, Urine: 1.024 (ref 1.005–1.030)
pH: 5 (ref 5.0–8.0)

## 2024-09-28 LAB — CBC WITH DIFFERENTIAL/PLATELET
Abs Immature Granulocytes: 0.05 K/uL (ref 0.00–0.07)
Basophils Absolute: 0 K/uL (ref 0.0–0.1)
Basophils Relative: 0 %
Eosinophils Absolute: 0.1 K/uL (ref 0.0–0.5)
Eosinophils Relative: 1 %
HCT: 37.8 % (ref 36.0–46.0)
Hemoglobin: 12 g/dL (ref 12.0–15.0)
Immature Granulocytes: 1 %
Lymphocytes Relative: 24 %
Lymphs Abs: 2.6 K/uL (ref 0.7–4.0)
MCH: 27.4 pg (ref 26.0–34.0)
MCHC: 31.7 g/dL (ref 30.0–36.0)
MCV: 86.3 fL (ref 80.0–100.0)
Monocytes Absolute: 0.5 K/uL (ref 0.1–1.0)
Monocytes Relative: 5 %
Neutro Abs: 7.4 K/uL (ref 1.7–7.7)
Neutrophils Relative %: 69 %
Platelets: 333 K/uL (ref 150–400)
RBC: 4.38 MIL/uL (ref 3.87–5.11)
RDW: 13.7 % (ref 11.5–15.5)
WBC: 10.8 K/uL — ABNORMAL HIGH (ref 4.0–10.5)
nRBC: 0 % (ref 0.0–0.2)

## 2024-09-28 LAB — PREGNANCY, URINE: Preg Test, Ur: NEGATIVE

## 2024-09-28 LAB — COMPREHENSIVE METABOLIC PANEL WITH GFR
ALT: 34 U/L (ref 0–44)
AST: 12 U/L — ABNORMAL LOW (ref 15–41)
Albumin: 4.4 g/dL (ref 3.5–5.0)
Alkaline Phosphatase: 147 U/L — ABNORMAL HIGH (ref 38–126)
Anion gap: 10 (ref 5–15)
BUN: 12 mg/dL (ref 6–20)
CO2: 24 mmol/L (ref 22–32)
Calcium: 9.1 mg/dL (ref 8.9–10.3)
Chloride: 104 mmol/L (ref 98–111)
Creatinine, Ser: 0.68 mg/dL (ref 0.44–1.00)
GFR, Estimated: >60 mL/min (ref >60)
Glucose, Bld: 90 mg/dL (ref 70–99)
Potassium: 4.2 mmol/L (ref 3.5–5.1)
Sodium: 138 mmol/L (ref 135–145)
Total Bilirubin: 0.2 mg/dL (ref 0.0–1.2)
Total Protein: 7.6 g/dL (ref 6.5–8.1)

## 2024-09-28 MED ORDER — ONDANSETRON 4 MG PO TBDP
4.0000 mg | ORAL_TABLET | Freq: Once | ORAL | Status: AC
  Administered 2024-09-28: 4 mg via ORAL
  Filled 2024-09-28: qty 1

## 2024-09-28 MED ORDER — HYDROCODONE-ACETAMINOPHEN 5-325 MG PO TABS
1.0000 | ORAL_TABLET | Freq: Once | ORAL | Status: AC
  Administered 2024-09-28: 1 via ORAL
  Filled 2024-09-28: qty 1

## 2024-09-28 NOTE — Discharge Instructions (Signed)

## 2024-09-28 NOTE — Telephone Encounter (Signed)
 Called pt regarding bleeding. Bleeding started on 11/15. Has been consistently saturating 1 pad in 1 hour. Still passing large clots, some the size of lemons and limes. Abdominal pain spreads from ovaries to back 6/10 at this moment. She reports being lightheaded and weak. Advised pt go to the ED for evaluation. States her mom can take her to the [facility] ED as it is the closest for her.  Pt states that this happens every time she has a period. Last period lasted 3 months. Pt would like a hysterectomy.  Sent aygestin instructions to pt via mychart per Dr. Rogelio.  Will send encounter to Dr. Rogelio so she is aware. Pt verbalizes understanding of all above and of aygestin instructions. Pt has no questions at this time. Will go to ED now.

## 2024-09-28 NOTE — ED Provider Notes (Signed)
 [facility] Provider Note    Event Date/Time   First MD Initiated Contact with Patient 09/28/24 1327     (approximate)   History   Vaginal Bleeding   HPI  Ashley [name] is a 33 y.o. female with a history of PCOS, abnormal uterine bleeding followed by [facility] who presents with 6 days of heavy vaginal bleeding and lightheadedness.  Patient is not on any hormonal treatments and denies any sexual interaction.  She denies any syncopal episodes shortness of breath or chest pain.  She reports passing large clots and has some suprapubic cramping occasionally.  She does have estrogen pills that were prescribed by her OB/GYN which she has not filled yet.  She presents with her mother who contributes to the history      Physical Exam   Triage Vital Signs: ED Triage Vitals [09/28/24 1246]  Encounter Vitals Group     BP (!) 144/78     Girls Systolic BP Percentile      Girls Diastolic BP Percentile      Boys Systolic BP Percentile      Boys Diastolic BP Percentile      Pulse Rate 71     Resp 16     Temp 98.4 F (36.9 C)     Temp Source Oral     SpO2 100 %     Weight 225 lb (102.1 kg)     Height 5' 6 (1.676 m)     Head Circumference      Peak Flow      Pain Score 5     Pain Loc      Pain Education      Exclude from Growth Chart     Most recent vital signs: Vitals:   09/28/24 1246  BP: (!) 144/78  Pulse: 71  Resp: 16  Temp: 98.4 F (36.9 C)  SpO2: 100%    Nursing Triage Note reviewed. Vital signs reviewed and patients oxygen saturation is normoxic  General: Patient is well nourished, well developed, awake and alert, appears unwell with active emesis Head: Normocephalic and atraumatic Eyes: Normal inspection, extraocular muscles intact, no conjunctival pallor Ear, nose, throat: Normal external exam Neck: Normal range of motion Respiratory: Patient is in no respiratory distress, lungs CTAB Cardiovascular: Patient is not tachycardic, RRR  without murmur appreciated GI: Abd SNT with no guarding or rebound  Back: Normal inspection of the back with good strength and range of motion throughout all ext Extremities: pulses intact with good cap refills, no LE pitting edema or calf tenderness Neuro: The patient is alert and oriented to person, place, and time, appropriately conversive, with 5/5 bilat UE/LE strength, no gross motor or sensory defects noted. Coordination appears to be adequate. Skin: Warm, dry, and intact Psych: normal mood and affect, no SI or HI  ED Results / Procedures / Treatments   Labs (all labs ordered are listed, but only abnormal results are displayed) Labs Reviewed  CBC WITH DIFFERENTIAL/PLATELET - Abnormal; Notable for the following components:      Result Value   WBC 10.8 (*)    All other components within normal limits  COMPREHENSIVE METABOLIC PANEL WITH GFR - Abnormal; Notable for the following components:   AST 12 (*)    Alkaline Phosphatase 147 (*)    All other components within normal limits  URINALYSIS, ROUTINE W REFLEX MICROSCOPIC - Abnormal; Notable for the following components:   Color, Urine YELLOW (*)    APPearance HAZY (*)  Hgb urine dipstick LARGE (*)    Bacteria, UA RARE (*)    All other components within normal limits  PREGNANCY, URINE  TYPE AND SCREEN     EKG None   RADIOLOGY US  pelvis: Large endometrial stripe on my independent review interpretation radiologist agrees    PROCEDURES:  Critical Care performed: No  Procedures   MEDICATIONS ORDERED IN ED: Medications  ondansetron  (ZOFRAN -ODT) disintegrating tablet 4 mg (4 mg Oral Given 09/28/24 1411)  HYDROcodone -acetaminophen  (NORCO/VICODIN) 5-325 MG per tablet 1 tablet (1 tablet Oral Given 09/28/24 1411)     IMPRESSION / MDM / ASSESSMENT AND PLAN / ED COURSE                               Differential diagnosis includes, but is not limited to: Abnormal uterine bleeding, pregnancy, acute anemia, electrolyte  derangement, PCOS, malignancy  ED course: Patient presents and she is well-perfused without any tachycardia or hypotension.  She is not acutely anemic and only has a very borderline elevation of her white blood cell count at 10.8.  She has no profound electrolyte derangements.  Liver function tests are at baseline for the patient.  Urinalysis was not consistent with UTI.  An ultrasound of the pelvis was completed demonstrating a large endometrial stripe.  Patient states that she has an appointment scheduled in January with her OB/GYN and does not think she could get in sooner.  Consequently I have provided the name of OB/GYN here at [facility] should she wish to change services.  She voiced understanding.  Reviewed return precautions and she voiced understanding  Clinical Course as of 09/28/24 2010  Fri Sep 28, 2024  1610 Urinalysis, Routine w reflex microscopic -Urine, Clean Catch(!) No signs of UTI [HD]  1610 CBC with Differential(!) No acute anemia [HD]  1610 Comprehensive metabolic panel(!) No profound derangements [HD]  1610 US  PELVIC COMPLETE WITH TRANSVAGINAL Nothing that requires an emergent surgery but will discuss results with patient [HD]  1630 I reviewed the results with the patient who voiced understanding.  She states that she already has a prescription for estrogen which she will pick up.  She was given follow-up with gynecology and will return if any acutely worsening symptoms [HD]    Clinical Course User Index [HD] Nicholaus Rolland BRAVO, MD   At time of discharge there is no evidence of acute life, limb, vision, or fertility threat. Patient has stable vital signs, pain is well controlled, patient is ambulatory and p.o. tolerant.  Discharge instructions were completed using the EPIC system. I would refer you to those at this time. All warnings prescriptions follow-up etc. were discussed in detail with the patient. Patient indicates understanding and is agreeable with this plan. All  questions answered.  Patient is made aware that they may return to the emergency department for any worsening or new condition or for any other emergency.  -- Risk: 5 This patient has a high risk of morbidity due to further diagnostic testing or treatment. Rationale: This patient's evaluation and management involve a high risk of morbidity due to the potential severity of presenting symptoms, need for diagnostic testing, and/or initiation of treatment that may require close monitoring. The differential includes conditions with potential for significant deterioration or requiring escalation of care. Treatment decisions in the ED, including medication administration, procedural interventions, or disposition planning, reflect this level of risk. COPA: 5 The patient has the following acute or chronic illness/injury that  poses a possible threat to life or bodily function: [X] : The patient has a potentially serious acute condition or an acute exacerbation of a chronic illness requiring urgent evaluation and management in the Emergency Department. The clinical presentation necessitates immediate consideration of life-threatening or function-threatening diagnoses, even if they are ultimately ruled out.   FINAL CLINICAL IMPRESSION(S) / ED DIAGNOSES   Final diagnoses:  Abnormal uterine bleeding     Rx / DC Orders   ED Discharge Orders     None        Note:  This document was prepared using Dragon voice recognition software and may include unintentional dictation errors.   Nicholaus Rolland BRAVO, MD 09/28/24 2010

## 2024-09-28 NOTE — ED Notes (Signed)
 Patient transported to Ultrasound

## 2024-09-28 NOTE — ED Triage Notes (Signed)
 Patient reports starting menstrual cycle on Saturday and has been having large amounts of bleeding. Patient appears pale. Reports she feels weak and numb in upper thighs.
